# Patient Record
Sex: Male | Born: 1990 | Race: Black or African American | Hispanic: No | Marital: Single | Smoking: Current every day smoker
Health system: Southern US, Community
[De-identification: ages and names within clinical notes are randomized; demographics above are authoritative.]

## PROBLEM LIST (undated history)

## (undated) DIAGNOSIS — Z789 Other specified health status: Secondary | ICD-10-CM

## (undated) DIAGNOSIS — J45909 Unspecified asthma, uncomplicated: Secondary | ICD-10-CM

## (undated) HISTORY — PX: NO PAST SURGERIES: SHX2092

## (undated) HISTORY — PX: FRACTURE SURGERY: SHX138

---
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
## 2012-10-22 ENCOUNTER — Other Ambulatory Visit (HOSPITAL_COMMUNITY)
Admission: RE | Admit: 2012-10-22 | Discharge: 2012-10-22 | Disposition: A | Discharge status: Home / Self Care | Payer: Self-pay | Source: Ambulatory Visit | Attending: Emergency Medicine | Admitting: Emergency Medicine

## 2012-10-22 ENCOUNTER — Emergency Department (INDEPENDENT_AMBULATORY_CARE_PROVIDER_SITE_OTHER)
Admission: EM | Admit: 2012-10-22 | Discharge: 2012-10-22 | Disposition: A | Discharge status: Home / Self Care | Payer: Self-pay | Source: Home / Self Care | Attending: Emergency Medicine | Admitting: Emergency Medicine

## 2012-10-22 ENCOUNTER — Encounter (HOSPITAL_COMMUNITY): Payer: Self-pay

## 2012-10-22 DIAGNOSIS — Z113 Encounter for screening for infections with a predominantly sexual mode of transmission: Secondary | ICD-10-CM | POA: Insufficient documentation

## 2012-10-22 DIAGNOSIS — A64 Unspecified sexually transmitted disease: Secondary | ICD-10-CM

## 2012-10-22 MED ORDER — AZITHROMYCIN 250 MG PO TABS: 1000.0000 mg | ORAL_TABLET | Freq: Every day | ORAL | Status: DC

## 2012-10-22 MED ORDER — AZITHROMYCIN 250 MG PO TABS
1000.0000 mg | ORAL_TABLET | Freq: Once | ORAL | Status: AC
Start: 2012-10-22 — End: 2012-10-22
  Administered 2012-10-22: 1000 mg via ORAL

## 2012-10-22 MED ORDER — CEFTRIAXONE SODIUM 250 MG IJ SOLR
INTRAMUSCULAR | Status: AC
  Filled 2012-10-22: qty 250

## 2012-10-22 MED ORDER — LIDOCAINE HCL (PF) 1 % IJ SOLN
INTRAMUSCULAR | Status: AC
  Filled 2012-10-22: qty 5

## 2012-10-22 MED ORDER — AZITHROMYCIN 250 MG PO TABS
ORAL_TABLET | ORAL | Status: AC
  Filled 2012-10-22: qty 4

## 2012-10-22 MED ORDER — CEFTRIAXONE SODIUM 250 MG IJ SOLR
250.0000 mg | Freq: Once | INTRAMUSCULAR | Status: AC
  Administered 2012-10-22: 250 mg via INTRAMUSCULAR

## 2012-10-22 NOTE — ED Provider Notes (Signed)
History     CSN: [id number]  Arrival date & time 10/22/12  1352   First MD Initiated Contact with Patient 10/22/12 1427      Chief Complaint  Patient presents with  . SEXUALLY TRANSMITTED DISEASE    (Consider location/radiation/quality/duration/timing/severity/associated sxs/prior treatment) HPI Comments: Patient presents urgent care as his sexual partner has been diagnosed with gonorrhea and chlamydia. Patient denies experiencing any symptoms such as increased frequency burning or pressure with urination and no penile discharge. No flank pain fevers pelvic pain or testicular pain. Patient denies having had a previous sexual transmitted illness  The history is provided by the patient.    History reviewed. No pertinent past medical history.  History reviewed. No pertinent past surgical history.  History reviewed. No pertinent family history.  History  Substance Use Topics  . Smoking status: Current Some Day Smoker  . Smokeless tobacco: Not on file  . Alcohol Use: Yes      Review of Systems  Constitutional: Negative for fever, chills and activity change.  Cardiovascular: Negative for chest pain.  Gastrointestinal: Negative for nausea, vomiting, abdominal pain, diarrhea and anal bleeding.  Genitourinary: Negative for dysuria, urgency, decreased urine volume, discharge, penile swelling, scrotal swelling, difficulty urinating, penile pain and testicular pain.  Skin: Negative for rash.    Allergies  Review of patient's allergies indicates no known allergies.  Home Medications  No current outpatient prescriptions on file.  BP 133/73  Pulse 79  Temp(Src) 98.3 F (36.8 C) (Oral)  Resp 16  SpO2 100%  Physical Exam  Nursing note and vitals reviewed. Constitutional: He appears well-developed and well-nourished.  Abdominal: Soft. He exhibits no distension. There is no tenderness.  Neurological: He is alert.  Skin: No rash noted. No erythema.    ED Course  Procedures  (including critical care time)  Labs Reviewed  URINE CYTOLOGY ANCILLARY ONLY   No results found.   1. Sexually transmissible disease       MDM  Partner of a patient diagnosed with both chlamydia and gonorrhea. Patient has been treated today with pulse after salt and azithromycin. Patient is asymptomatic tolerated treatment well he urine sample was collected to be screened for STDs.        Jimmie Molly, MD 10/22/12 [phone]

## 2012-10-22 NOTE — ED Notes (Signed)
Partner here for STD exam ; denies syx

## 2012-10-26 ENCOUNTER — Telehealth (HOSPITAL_COMMUNITY): Payer: Self-pay | Admitting: *Deleted

## 2012-10-26 NOTE — ED Notes (Signed)
GC neg., Chlamydia pos., Trich neg.  Pt. adequately treated with Zithromax and Rocephin.  Unable to contact pt. by phone.  Pt.'s cell number does not accept incoming calls and emergency contact number is not valid. Confidential letter sent with diagnosis and STD instructions.  DHHS form completed and faxed to the [facility]. Vassie Moselle 10/26/2012

## 2013-06-08 ENCOUNTER — Emergency Department (HOSPITAL_COMMUNITY)
Admission: EM | Admit: 2013-06-08 | Discharge: 2013-06-08 | Disposition: A | Discharge status: Home / Self Care | Payer: Self-pay | Attending: Emergency Medicine | Admitting: Emergency Medicine

## 2013-06-08 ENCOUNTER — Encounter (HOSPITAL_COMMUNITY): Payer: Self-pay | Admitting: Emergency Medicine

## 2013-06-08 DIAGNOSIS — Z0000 Encounter for general adult medical examination without abnormal findings: Secondary | ICD-10-CM

## 2013-06-08 DIAGNOSIS — F17200 Nicotine dependence, unspecified, uncomplicated: Secondary | ICD-10-CM | POA: Insufficient documentation

## 2013-06-08 DIAGNOSIS — Z008 Encounter for other general examination: Secondary | ICD-10-CM | POA: Insufficient documentation

## 2013-06-08 NOTE — ED Notes (Signed)
Pt states that he was drinking tonight and took 4 aleve, then his stomach started cramping and he was bending over, his girlfriend started panicking and called ems and [employer], they brought him here to be evaluated. Pt states that he's not trying to harm himself he's just a little drunk.
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
## 2014-02-02 ENCOUNTER — Encounter (HOSPITAL_COMMUNITY): Payer: Self-pay | Admitting: Emergency Medicine

## 2014-02-02 ENCOUNTER — Encounter (HOSPITAL_COMMUNITY)
Admission: EM | Disposition: A | Discharge status: Home / Self Care | Payer: Self-pay | Source: Home / Self Care | Attending: Emergency Medicine

## 2014-02-02 ENCOUNTER — Emergency Department (INDEPENDENT_AMBULATORY_CARE_PROVIDER_SITE_OTHER): Payer: Self-pay

## 2014-02-02 ENCOUNTER — Observation Stay (HOSPITAL_COMMUNITY)
Admission: EM | Admit: 2014-02-02 | Discharge: 2014-02-03 | Disposition: A | Discharge status: Home / Self Care | Payer: Self-pay | Attending: Orthopedic Surgery | Admitting: Orthopedic Surgery

## 2014-02-02 ENCOUNTER — Emergency Department (INDEPENDENT_AMBULATORY_CARE_PROVIDER_SITE_OTHER)
Admission: EM | Admit: 2014-02-02 | Discharge: 2014-02-02 | Disposition: A | Discharge status: Nursing Home (Includes ICF) | Payer: Self-pay | Source: Home / Self Care

## 2014-02-02 ENCOUNTER — Encounter (HOSPITAL_COMMUNITY): Payer: Self-pay | Admitting: Anesthesiology

## 2014-02-02 ENCOUNTER — Emergency Department (HOSPITAL_COMMUNITY): Payer: Self-pay | Admitting: Anesthesiology

## 2014-02-02 ENCOUNTER — Emergency Department (HOSPITAL_COMMUNITY): Payer: Self-pay

## 2014-02-02 DIAGNOSIS — S61409A Unspecified open wound of unspecified hand, initial encounter: Principal | ICD-10-CM | POA: Insufficient documentation

## 2014-02-02 DIAGNOSIS — S61412A Laceration without foreign body of left hand, initial encounter: Secondary | ICD-10-CM

## 2014-02-02 DIAGNOSIS — Y289XXA Contact with unspecified sharp object, undetermined intent, initial encounter: Secondary | ICD-10-CM | POA: Insufficient documentation

## 2014-02-02 DIAGNOSIS — L089 Local infection of the skin and subcutaneous tissue, unspecified: Secondary | ICD-10-CM

## 2014-02-02 DIAGNOSIS — L02519 Cutaneous abscess of unspecified hand: Secondary | ICD-10-CM

## 2014-02-02 DIAGNOSIS — M009 Pyogenic arthritis, unspecified: Secondary | ICD-10-CM | POA: Insufficient documentation

## 2014-02-02 DIAGNOSIS — F17200 Nicotine dependence, unspecified, uncomplicated: Secondary | ICD-10-CM | POA: Insufficient documentation

## 2014-02-02 DIAGNOSIS — S61432A Puncture wound without foreign body of left hand, initial encounter: Secondary | ICD-10-CM

## 2014-02-02 DIAGNOSIS — IMO0002 Reserved for concepts with insufficient information to code with codable children: Secondary | ICD-10-CM

## 2014-02-02 DIAGNOSIS — L03119 Cellulitis of unspecified part of limb: Secondary | ICD-10-CM

## 2014-02-02 DIAGNOSIS — S62339B Displaced fracture of neck of unspecified metacarpal bone, initial encounter for open fracture: Secondary | ICD-10-CM | POA: Insufficient documentation

## 2014-02-02 DIAGNOSIS — L03114 Cellulitis of left upper limb: Secondary | ICD-10-CM

## 2014-02-02 HISTORY — DX: Other specified health status: Z78.9

## 2014-02-02 HISTORY — PX: I&D EXTREMITY: SHX5045

## 2014-02-02 LAB — CBC WITH DIFFERENTIAL/PLATELET
BASOS ABS: 0 10*3/uL (ref 0.0–0.1)
BASOS PCT: 0 % (ref 0–1)
EOS PCT: 2 % (ref 0–5)
Eosinophils Absolute: 0.2 10*3/uL (ref 0.0–0.7)
HEMATOCRIT: 41.2 % (ref 39.0–52.0)
Hemoglobin: 13.5 g/dL (ref 13.0–17.0)
LYMPHS PCT: 24 % (ref 12–46)
Lymphs Abs: 2 10*3/uL (ref 0.7–4.0)
MCH: 30.8 pg (ref 26.0–34.0)
MCHC: 32.8 g/dL (ref 30.0–36.0)
MCV: 93.8 fL (ref 78.0–100.0)
MONO ABS: 0.7 10*3/uL (ref 0.1–1.0)
MONOS PCT: 9 % (ref 3–12)
Neutro Abs: 5.3 10*3/uL (ref 1.7–7.7)
Neutrophils Relative %: 65 % (ref 43–77)
Platelets: 171 10*3/uL (ref 150–400)
RBC: 4.39 MIL/uL (ref 4.22–5.81)
RDW: 13.5 % (ref 11.5–15.5)
WBC: 8.2 10*3/uL (ref 4.0–10.5)

## 2014-02-02 LAB — BASIC METABOLIC PANEL
ANION GAP: 11 (ref 5–15)
BUN: 11 mg/dL (ref 6–23)
CALCIUM: 9.1 mg/dL (ref 8.4–10.5)
CO2: 27 meq/L (ref 19–32)
CREATININE: 1.08 mg/dL (ref 0.50–1.35)
Chloride: 103 mEq/L (ref 96–112)
GFR calc Af Amer: >90 mL/min (ref >90)
GFR calc non Af Amer: >90 mL/min (ref >90)
Glucose, Bld: 76 mg/dL (ref 70–99)
Potassium: 4.2 mEq/L (ref 3.7–5.3)
Sodium: 141 mEq/L (ref 137–147)

## 2014-02-02 SURGERY — IRRIGATION AND DEBRIDEMENT EXTREMITY
Anesthesia: General | Site: Hand | Laterality: Left

## 2014-02-02 MED ORDER — OXYCODONE HCL 5 MG PO TABS
5.0000 mg | ORAL_TABLET | Freq: Once | ORAL | Status: AC | PRN
  Administered 2014-02-02: 5 mg via ORAL

## 2014-02-02 MED ORDER — SODIUM CHLORIDE 0.9 % IR SOLN
Status: DC | PRN
  Administered 2014-02-02: 1000 mL
  Administered 2014-02-02: 3000 mL

## 2014-02-02 MED ORDER — SODIUM CHLORIDE 0.9 % IV SOLN
3.0000 g | Freq: Four times a day (QID) | INTRAVENOUS | Status: DC
  Administered 2014-02-03: 3 g via INTRAVENOUS
  Filled 2014-02-02 (×4): qty 3

## 2014-02-02 MED ORDER — OXYCODONE HCL 5 MG/5ML PO SOLN: 5.0000 mg | Freq: Once | ORAL | Status: AC | PRN

## 2014-02-02 MED ORDER — ADULT MULTIVITAMIN W/MINERALS CH
1.0000 | ORAL_TABLET | Freq: Every day | ORAL | Status: DC
Start: 2014-02-03 — End: 2014-02-03
  Filled 2014-02-02: qty 1

## 2014-02-02 MED ORDER — HYDROMORPHONE HCL PF 1 MG/ML IJ SOLN: 0.5000 mg | INTRAMUSCULAR | Status: DC | PRN

## 2014-02-02 MED ORDER — ONDANSETRON HCL 4 MG PO TABS: 4.0000 mg | ORAL_TABLET | Freq: Four times a day (QID) | ORAL | Status: DC | PRN

## 2014-02-02 MED ORDER — PHENYLEPHRINE 40 MCG/ML (10ML) SYRINGE FOR IV PUSH (FOR BLOOD PRESSURE SUPPORT)
PREFILLED_SYRINGE | INTRAVENOUS | Status: AC
  Filled 2014-02-02: qty 10

## 2014-02-02 MED ORDER — METHOCARBAMOL 1000 MG/10ML IJ SOLN
500.0000 mg | Freq: Four times a day (QID) | INTRAVENOUS | Status: DC | PRN
  Filled 2014-02-02: qty 5

## 2014-02-02 MED ORDER — ONDANSETRON HCL 4 MG/2ML IJ SOLN: 4.0000 mg | Freq: Four times a day (QID) | INTRAMUSCULAR | Status: DC | PRN

## 2014-02-02 MED ORDER — DIPHENHYDRAMINE HCL 25 MG PO CAPS: 25.0000 mg | ORAL_CAPSULE | Freq: Four times a day (QID) | ORAL | Status: DC | PRN

## 2014-02-02 MED ORDER — HYDROMORPHONE HCL PF 1 MG/ML IJ SOLN
1.0000 mg | Freq: Once | INTRAMUSCULAR | Status: AC
  Administered 2014-02-02: 1 mg via INTRAVENOUS
  Filled 2014-02-02: qty 1

## 2014-02-02 MED ORDER — ONDANSETRON HCL 4 MG/2ML IJ SOLN
INTRAMUSCULAR | Status: AC
  Filled 2014-02-02: qty 2

## 2014-02-02 MED ORDER — SUCCINYLCHOLINE CHLORIDE 20 MG/ML IJ SOLN
INTRAMUSCULAR | Status: DC | PRN
  Administered 2014-02-02: 120 mg via INTRAVENOUS

## 2014-02-02 MED ORDER — FENTANYL CITRATE 0.05 MG/ML IJ SOLN
50.0000 ug | INTRAMUSCULAR | Status: DC | PRN
  Administered 2014-02-02 (×2): 50 ug via INTRAVENOUS
  Filled 2014-02-02 (×2): qty 2

## 2014-02-02 MED ORDER — METHOCARBAMOL 500 MG PO TABS
500.0000 mg | ORAL_TABLET | Freq: Four times a day (QID) | ORAL | Status: DC | PRN
  Administered 2014-02-02 – 2014-02-03 (×2): 500 mg via ORAL
  Filled 2014-02-02 (×2): qty 1

## 2014-02-02 MED ORDER — ONDANSETRON HCL 4 MG/2ML IJ SOLN
INTRAMUSCULAR | Status: DC | PRN
  Administered 2014-02-02: 4 mg via INTRAVENOUS

## 2014-02-02 MED ORDER — OXYCODONE-ACETAMINOPHEN 5-325 MG PO TABS
1.0000 | ORAL_TABLET | ORAL | Status: DC | PRN
  Administered 2014-02-03 (×2): 2 via ORAL
  Filled 2014-02-02 (×2): qty 2

## 2014-02-02 MED ORDER — VITAMIN C 500 MG PO TABS
1000.0000 mg | ORAL_TABLET | Freq: Every day | ORAL | Status: DC
  Filled 2014-02-02: qty 2

## 2014-02-02 MED ORDER — MIDAZOLAM HCL 2 MG/2ML IJ SOLN
INTRAMUSCULAR | Status: AC
  Filled 2014-02-02: qty 2

## 2014-02-02 MED ORDER — LACTATED RINGERS IV SOLN
INTRAVENOUS | Status: DC | PRN
  Administered 2014-02-02: 21:00:00 via INTRAVENOUS

## 2014-02-02 MED ORDER — AMOXICILLIN-POT CLAVULANATE 875-125 MG PO TABS: 1.0000 | ORAL_TABLET | Freq: Two times a day (BID) | ORAL | Status: DC

## 2014-02-02 MED ORDER — SODIUM CHLORIDE 0.9 % IV SOLN
3.0000 g | INTRAVENOUS | Status: AC
  Administered 2014-02-02: 3 g via INTRAVENOUS
  Filled 2014-02-02: qty 3

## 2014-02-02 MED ORDER — HYDROMORPHONE HCL PF 1 MG/ML IJ SOLN
INTRAMUSCULAR | Status: AC
  Filled 2014-02-02: qty 1

## 2014-02-02 MED ORDER — SUCCINYLCHOLINE CHLORIDE 20 MG/ML IJ SOLN
INTRAMUSCULAR | Status: AC
  Filled 2014-02-02: qty 1

## 2014-02-02 MED ORDER — ARTIFICIAL TEARS OP OINT
TOPICAL_OINTMENT | OPHTHALMIC | Status: AC
  Filled 2014-02-02: qty 3.5

## 2014-02-02 MED ORDER — ARTIFICIAL TEARS OP OINT
TOPICAL_OINTMENT | OPHTHALMIC | Status: DC | PRN
  Administered 2014-02-02: 1 via OPHTHALMIC

## 2014-02-02 MED ORDER — HYDROCODONE-ACETAMINOPHEN 5-325 MG PO TABS
1.0000 | ORAL_TABLET | ORAL | Status: DC | PRN
  Administered 2014-02-03 (×2): 2 via ORAL
  Filled 2014-02-02 (×2): qty 2

## 2014-02-02 MED ORDER — LIDOCAINE HCL (CARDIAC) 20 MG/ML IV SOLN
INTRAVENOUS | Status: DC | PRN
  Administered 2014-02-02: 100 mg via INTRAVENOUS

## 2014-02-02 MED ORDER — FENTANYL CITRATE 0.05 MG/ML IJ SOLN
INTRAMUSCULAR | Status: DC | PRN
  Administered 2014-02-02 (×2): 100 ug via INTRAVENOUS
  Administered 2014-02-02: 50 ug via INTRAVENOUS

## 2014-02-02 MED ORDER — PROPOFOL 10 MG/ML IV BOLUS
INTRAVENOUS | Status: DC | PRN
  Administered 2014-02-02: 200 mg via INTRAVENOUS

## 2014-02-02 MED ORDER — ONDANSETRON HCL 4 MG/2ML IJ SOLN
4.0000 mg | Freq: Once | INTRAMUSCULAR | Status: AC
Start: 2014-02-02 — End: 2014-02-02
  Administered 2014-02-02: 4 mg via INTRAVENOUS
  Filled 2014-02-02: qty 2

## 2014-02-02 MED ORDER — SODIUM CHLORIDE 0.9 % IV BOLUS (SEPSIS)
1000.0000 mL | Freq: Once | INTRAVENOUS | Status: AC
  Administered 2014-02-02: 1000 mL via INTRAVENOUS

## 2014-02-02 MED ORDER — KCL IN DEXTROSE-NACL 20-5-0.45 MEQ/L-%-% IV SOLN
INTRAVENOUS | Status: DC
  Administered 2014-02-03: 01:00:00 via INTRAVENOUS
  Filled 2014-02-02 (×3): qty 1000

## 2014-02-02 MED ORDER — FENTANYL CITRATE 0.05 MG/ML IJ SOLN
INTRAMUSCULAR | Status: AC
  Administered 2014-02-02: 50 ug
  Filled 2014-02-02: qty 2

## 2014-02-02 MED ORDER — DOCUSATE SODIUM 100 MG PO CAPS: 100.0000 mg | ORAL_CAPSULE | Freq: Two times a day (BID) | ORAL | Status: DC

## 2014-02-02 MED ORDER — OXYCODONE-ACETAMINOPHEN 10-325 MG PO TABS: 1.0000 | ORAL_TABLET | ORAL | Status: DC | PRN

## 2014-02-02 MED ORDER — ZOLPIDEM TARTRATE 5 MG PO TABS
5.0000 mg | ORAL_TABLET | Freq: Every evening | ORAL | Status: DC | PRN
  Administered 2014-02-02: 5 mg via ORAL
  Filled 2014-02-02: qty 1

## 2014-02-02 MED ORDER — FENTANYL CITRATE 0.05 MG/ML IJ SOLN
INTRAMUSCULAR | Status: AC
  Filled 2014-02-02: qty 5

## 2014-02-02 MED ORDER — HYDROMORPHONE HCL PF 1 MG/ML IJ SOLN
0.2500 mg | INTRAMUSCULAR | Status: DC | PRN
  Administered 2014-02-02 (×2): 0.5 mg via INTRAVENOUS

## 2014-02-02 MED ORDER — OXYCODONE HCL 5 MG PO TABS
ORAL_TABLET | ORAL | Status: AC
  Filled 2014-02-02: qty 1

## 2014-02-02 MED ORDER — PROPOFOL 10 MG/ML IV BOLUS
INTRAVENOUS | Status: AC
  Filled 2014-02-02: qty 20

## 2014-02-02 SURGICAL SUPPLY — 55 items
BANDAGE ELASTIC 3 VELCRO ST LF (GAUZE/BANDAGES/DRESSINGS) ×3 IMPLANT
BANDAGE ELASTIC 4 VELCRO ST LF (GAUZE/BANDAGES/DRESSINGS) ×3 IMPLANT
BANDAGE GAUZE ELAST BULKY 4 IN (GAUZE/BANDAGES/DRESSINGS) ×3 IMPLANT
BNDG COHESIVE 1X5 TAN STRL LF (GAUZE/BANDAGES/DRESSINGS) IMPLANT
BNDG CONFORM 2 STRL LF (GAUZE/BANDAGES/DRESSINGS) ×3 IMPLANT
BNDG ESMARK 4X9 LF (GAUZE/BANDAGES/DRESSINGS) ×3 IMPLANT
BNDG GAUZE ELAST 4 BULKY (GAUZE/BANDAGES/DRESSINGS) ×3 IMPLANT
CORDS BIPOLAR (ELECTRODE) ×3 IMPLANT
COVER SURGICAL LIGHT HANDLE (MISCELLANEOUS) ×3 IMPLANT
CUFF TOURNIQUET SINGLE 18IN (TOURNIQUET CUFF) ×3 IMPLANT
CUFF TOURNIQUET SINGLE 24IN (TOURNIQUET CUFF) IMPLANT
DRAIN PENROSE 1/4X12 LTX STRL (WOUND CARE) IMPLANT
DRAPE SURG 17X23 STRL (DRAPES) ×3 IMPLANT
DRSG ADAPTIC 3X8 NADH LF (GAUZE/BANDAGES/DRESSINGS) ×3 IMPLANT
ELECT REM PT RETURN 9FT ADLT (ELECTROSURGICAL) ×3
ELECTRODE REM PT RTRN 9FT ADLT (ELECTROSURGICAL) ×1 IMPLANT
GAUZE XEROFORM 1X8 LF (GAUZE/BANDAGES/DRESSINGS) ×3 IMPLANT
GAUZE XEROFORM 5X9 LF (GAUZE/BANDAGES/DRESSINGS) IMPLANT
GLOVE BIOGEL PI IND STRL 8.5 (GLOVE) ×1 IMPLANT
GLOVE BIOGEL PI INDICATOR 8.5 (GLOVE) ×2
GLOVE SURG ORTHO 8.0 STRL STRW (GLOVE) ×3 IMPLANT
GOWN STRL REUS W/ TWL LRG LVL3 (GOWN DISPOSABLE) ×3 IMPLANT
GOWN STRL REUS W/ TWL XL LVL3 (GOWN DISPOSABLE) ×1 IMPLANT
GOWN STRL REUS W/TWL LRG LVL3 (GOWN DISPOSABLE) ×6
GOWN STRL REUS W/TWL XL LVL3 (GOWN DISPOSABLE) ×2
HANDPIECE INTERPULSE COAX TIP (DISPOSABLE)
KIT BASIN OR (CUSTOM PROCEDURE TRAY) ×3 IMPLANT
KIT ROOM TURNOVER OR (KITS) ×3 IMPLANT
MANIFOLD NEPTUNE II (INSTRUMENTS) ×3 IMPLANT
NEEDLE HYPO 25GX1X1/2 BEV (NEEDLE) IMPLANT
NS IRRIG 1000ML POUR BTL (IV SOLUTION) ×3 IMPLANT
PACK ORTHO EXTREMITY (CUSTOM PROCEDURE TRAY) ×3 IMPLANT
PAD ARMBOARD 7.5X6 YLW CONV (MISCELLANEOUS) ×6 IMPLANT
PAD CAST 4YDX4 CTTN HI CHSV (CAST SUPPLIES) ×1 IMPLANT
PADDING CAST COTTON 4X4 STRL (CAST SUPPLIES) ×2
SET HNDPC FAN SPRY TIP SCT (DISPOSABLE) IMPLANT
SOAP 2 % CHG 4 OZ (WOUND CARE) ×3 IMPLANT
SPLINT FIBERGLASS 3X35 (CAST SUPPLIES) ×3 IMPLANT
SPONGE GAUZE 4X4 12PLY (GAUZE/BANDAGES/DRESSINGS) ×3 IMPLANT
SPONGE LAP 18X18 X RAY DECT (DISPOSABLE) ×3 IMPLANT
SPONGE LAP 4X18 X RAY DECT (DISPOSABLE) ×3 IMPLANT
SUCTION FRAZIER TIP 10 FR DISP (SUCTIONS) ×3 IMPLANT
SUT ETHILON 4 0 PS 2 18 (SUTURE) IMPLANT
SUT ETHILON 5 0 P 3 18 (SUTURE) ×2
SUT NYLON ETHILON 5-0 P-3 1X18 (SUTURE) ×1 IMPLANT
SUT PROLENE 3 0 PS 2 (SUTURE) ×3 IMPLANT
SYR CONTROL 10ML LL (SYRINGE) IMPLANT
TOWEL OR 17X24 6PK STRL BLUE (TOWEL DISPOSABLE) ×3 IMPLANT
TOWEL OR 17X26 10 PK STRL BLUE (TOWEL DISPOSABLE) ×3 IMPLANT
TUBE ANAEROBIC SPECIMEN COL (MISCELLANEOUS) ×3 IMPLANT
TUBE CONNECTING 12'X1/4 (SUCTIONS) ×1
TUBE CONNECTING 12X1/4 (SUCTIONS) ×2 IMPLANT
UNDERPAD 30X30 INCONTINENT (UNDERPADS AND DIAPERS) ×3 IMPLANT
WATER STERILE IRR 1000ML POUR (IV SOLUTION) ×3 IMPLANT
YANKAUER SUCT BULB TIP NO VENT (SUCTIONS) ×3 IMPLANT

## 2014-02-02 NOTE — H&P (Signed)
Luis Huynh is an 23 y.o. male.   Chief Complaint: left hand pain HPI:  Patient is a 23 y.o. male presenting with hand injury. The history is provided by the patient.  Hand Injury Associated symptoms: no back pain, no fever and no neck pain  pt states 2 days ago hit glass window w left hand. Laceration middle finger and left MCP region at time of injury. Since then, persistent/worsening pain, swelling, and redness to left hand. Purulent drainage from wound. Pt denies hitting a mouth or tooth. Tetanus unknown. Right hand dominant. No fever or chills. Denies associated numbness/weakness. Denies any other pain or injury.   History reviewed. No pertinent past medical history.  History reviewed. No pertinent past surgical history.  History reviewed. No pertinent family history. Social History:  reports that he has been smoking.  He does not have any smokeless tobacco history on file. He reports that he drinks alcohol. His drug history is not on file.  Allergies:  Allergies  Allergen Reactions  . Xanax [Alprazolam] Itching    Possible allergy.Evelina Bucy onetime and made him itch    No prescriptions prior to admission    Results for orders placed during the hospital encounter of 02/02/14 (from the past 48 hour(s))  CBC WITH DIFFERENTIAL     Status: None   Collection Time    02/02/14  1:32 PM      Result Value Ref Range   WBC 8.2  4.0 - 10.5 K/uL   RBC 4.39  4.22 - 5.81 MIL/uL   Hemoglobin 13.5  13.0 - 17.0 g/dL   HCT 41.2  39.0 - 52.0 %   MCV 93.8  78.0 - 100.0 fL   MCH 30.8  26.0 - 34.0 pg   MCHC 32.8  30.0 - 36.0 g/dL   RDW 13.5  11.5 - 15.5 %   Platelets 171  150 - 400 K/uL   Neutrophils Relative % 65  43 - 77 %   Neutro Abs 5.3  1.7 - 7.7 K/uL   Lymphocytes Relative 24  12 - 46 %   Lymphs Abs 2.0  0.7 - 4.0 K/uL   Monocytes Relative 9  3 - 12 %   Monocytes Absolute 0.7  0.1 - 1.0 K/uL   Eosinophils Relative 2  0 - 5 %   Eosinophils Absolute 0.2  0.0 - 0.7 K/uL   Basophils  Relative 0  0 - 1 %   Basophils Absolute 0.0  0.0 - 0.1 K/uL  BASIC METABOLIC PANEL     Status: None   Collection Time    02/02/14  1:32 PM      Result Value Ref Range   Sodium 141  137 - 147 mEq/L   Potassium 4.2  3.7 - 5.3 mEq/L   Chloride 103  96 - 112 mEq/L   CO2 27  19 - 32 mEq/L   Glucose, Bld 76  70 - 99 mg/dL   BUN 11  6 - 23 mg/dL   Creatinine, Ser 1.08  0.50 - 1.35 mg/dL   Calcium 9.1  8.4 - 10.5 mg/dL   GFR calc non Af Amer >90  >90 mL/min   GFR calc Af Amer >90  >90 mL/min   Comment: (NOTE)     The eGFR has been calculated using the CKD EPI equation.     This calculation has not been validated in all clinical situations.     eGFR's persistently <90 mL/min signify possible Chronic Kidney  Disease.   Anion gap 11  5 - 15   Dg Hand Complete Left  02/02/2014   CLINICAL DATA:  Punched glass 2 days ago.  Left hand pain  EXAM: LEFT HAND - COMPLETE 3+ VIEW  COMPARISON:  None.  FINDINGS: There is no evidence of fracture or dislocation. There is no evidence of arthropathy or other focal bone abnormality. Soft tissues are unremarkable.  IMPRESSION: Negative.   Electronically Signed   By: Kathreen Devoid   On: 02/02/2014 11:51    ROS  NO RECENT ILLNESSES OR HOSPITALIZATIONS   Blood pressure 161/82, pulse 82, temperature 97.8 F (36.6 C), temperature source Oral, resp. rate 20, weight 68.04 kg (150 lb), SpO2 100.00%. Physical Exam  General Appearance:  Alert, cooperative, no distress, appears stated age  Head:  Normocephalic, without obvious abnormality, atraumatic  Eyes:  Pupils equal, conjunctiva/corneas clear,         Throat: Lips, mucosa, and tongue normal; teeth and gums normal  Neck: No visible masses     Lungs:   respirations unlabored  Chest Wall:  No tenderness or deformity  Heart:  Regular rate and rhythm,  Abdomen:   Soft, non-tender,         Extremities: LEFT HAND: DORSAL WOUND OVER LONG FINGER MCP REGION, PICTURES IN CHART FINGERS WARM WELL PERFUSED LIMITED  DIGITAL MOTION  Pulses: 2+ and symmetric  Skin: Skin color, texture, turgor normal, no rashes or lesions     Neurologic: Normal    Assessment/Plan LEFT HAND MCP JOINT INFECTION  LEFT HAND MCP JOINT IRRIGATION AND DEBRIDEMENT AND REPAIR AS INDICATED  R/B/A DISCUSSED WITH PT IN HOSPITAL.  PT VOICED UNDERSTANDING OF PLAN CONSENT SIGNED DAY OF SURGERY PT SEEN AND EXAMINED PRIOR TO OPERATIVE PROCEDURE/DAY OF SURGERY SITE MARKED. QUESTIONS ANSWERED WILL GO HOME FOLLOWING SURGERY  WE ARE PLANNING SURGERY FOR YOUR UPPER EXTREMITY. THE RISKS AND BENEFITS OF SURGERY INCLUDE BUT NOT LIMITED TO BLEEDING INFECTION, DAMAGE TO NEARBY NERVES ARTERIES TENDONS, FAILURE OF SURGERY TO ACCOMPLISH ITS INTENDED GOALS, PERSISTENT SYMPTOMS AND NEED FOR FURTHER SURGICAL INTERVENTION. WITH THIS IN MIND WE WILL PROCEED. I HAVE DISCUSSED WITH THE PATIENT THE PRE AND POSTOPERATIVE REGIMEN AND THE DOS AND DON'TS. PT VOICED UNDERSTANDING AND INFORMED CONSENT SIGNED.  Linna Hoff 02/02/2014, 8:55 PM

## 2014-02-02 NOTE — Transfer of Care (Signed)
Immediate Anesthesia Transfer of Care Note  Patient: Luis Huynh  Procedure(s) Performed: Procedure(s): Incision and Drainage of left hand Infection (Left)  Patient Location: PACU  Anesthesia Type:General  Level of Consciousness: awake  Airway & Oxygen Therapy: Patient Spontanous Breathing and Patient connected to nasal cannula oxygen  Post-op Assessment: Report given to PACU RN, Post -op Vital signs reviewed and stable and Patient moving all extremities  Post vital signs: Reviewed and stable  Complications: No apparent anesthesia complications

## 2014-02-02 NOTE — Anesthesia Procedure Notes (Signed)
Procedure Name: Intubation Date/Time: 02/02/2014 8:55 PM Performed by: Luster LandsbergHASE, Megan Presti R Pre-anesthesia Checklist: Patient identified, Emergency Drugs available, Suction available and Patient being monitored Patient Re-evaluated:Patient Re-evaluated prior to inductionOxygen Delivery Method: Circle system utilized Preoxygenation: Pre-oxygenation with 100% oxygen Intubation Type: IV induction, Rapid sequence and Cricoid Pressure applied Laryngoscope Size: Mac and 3 Grade View: Grade I Tube type: Oral Tube size: 7.5 mm Number of attempts: 1 Airway Equipment and Method: Stylet Placement Confirmation: ETT inserted through vocal cords under direct vision and breath sounds checked- equal and bilateral Secured at: 22 cm Tube secured with: Tape Dental Injury: Teeth and Oropharynx as per pre-operative assessment

## 2014-02-02 NOTE — ED Notes (Signed)
Pt. Very upset. Put all of his clothes on and took off monitor cords. States "I want to go home". Explained to patient risks of leaving. Pt. Agreed to stay. Updated patient on plan of care. Delayed explained. Pt. Angry but cooperating at this time.

## 2014-02-02 NOTE — ED Provider Notes (Signed)
CSN: [id number]     Arrival date & time 02/02/14  1053 History   First MD Initiated Contact with Patient 02/02/14 1117     Chief Complaint  Patient presents with  . Laceration   (Consider location/radiation/quality/duration/timing/severity/associated sxs/prior Treatment) HPI Comments: 23 y o m punched glass window 2 d ago producing a wound over the Left 3rd MCP. Now with marked swelling over the wound and hand. Wound is purulent.    History reviewed. No pertinent past medical history. History reviewed. No pertinent past surgical history. History reviewed. No pertinent family history. History  Substance Use Topics  . Smoking status: Current Some Day Smoker  . Smokeless tobacco: Not on file  . Alcohol Use: Yes    Review of Systems  Constitutional: Negative.   Respiratory: Negative.   Cardiovascular: Negative for chest pain.  Genitourinary: Negative.   Musculoskeletal: Positive for joint swelling.       As per HPI  Skin: Positive for wound.  Neurological: Negative for dizziness, weakness, numbness and headaches.    Allergies  Review of patient's allergies indicates no known allergies.  Home Medications   Prior to Admission medications   Not on File   BP 133/97  Pulse 70  Temp(Src) 99.3 F (37.4 C) (Oral)  Resp 14  SpO2 100% Physical Exam  Nursing note and vitals reviewed. Constitutional: He is oriented to person, place, and time. He appears well-developed and well-nourished. No distress.  HENT:  Head: Normocephalic and atraumatic.  Neck: Normal range of motion. Neck supple.  Cardiovascular: Normal rate.   Pulmonary/Chest: Effort normal. No respiratory distress.  Musculoskeletal:  Edema to hand, lesser to 3rd finger. No finger tenderness. Much tenderness to the hand, greatest over the MCP at the site of the wound. Limited ROM of 3rd digit re flex and ext off the MCP.   Neurological: He is alert and oriented to person, place, and time. No cranial nerve deficit.   Skin: Skin is warm and dry.  Stellate type laceration over 3rd MCP with crusting and purulent drainage. Underlying swelling. Faint lymphangitis into the forearm.  Psychiatric: He has a normal mood and affect.    ED Course  Procedures (including critical care time) Labs Review Labs Reviewed  WOUND CULTURE    Imaging Review Dg Hand Complete Left  02/02/2014   CLINICAL DATA:  Punched glass 2 days ago.  Left hand pain  EXAM: LEFT HAND - COMPLETE 3+ VIEW  COMPARISON:  None.  FINDINGS: There is no evidence of fracture or dislocation. There is no evidence of arthropathy or other focal bone abnormality. Soft tissues are unremarkable.  IMPRESSION: Negative.   Electronically Signed   By: Elige KoHetal  Patel   On: 02/02/2014 11:51     MDM   1. Laceration of hand with infection, left, initial encounter   2. Cellulitis of hand, left    Evaluation of left hand infection/cellulits with limited function of 3rd MCP joint.  Spoke with Angie at [facility]. Dr. Melvyn Novasrtmann on call and currently in surgery. Advised to send to the ED as they will consult from there.      Hayden Rasmussenavid Zaniah [place], NP 02/02/14 1215

## 2014-02-02 NOTE — ED Notes (Signed)
MD asked about pain medicine for patient

## 2014-02-02 NOTE — Discharge Instructions (Signed)
KEEP [facility] CALL OFFICE FOR F/U APPT [phone] on monday 02/07/2014 KEEP HAND ELEVATED ABOVE HEART OK TO APPLY ICE TO OPERATIVE AREA CONTACT OFFICE IF ANY WORSENING PAIN OR CONCERNS.

## 2014-02-02 NOTE — ED Notes (Signed)
Admitting MD paged about pain medication again

## 2014-02-02 NOTE — ED Notes (Signed)
Pt in c/o possible infection to wound on left hand, pt punched something with glass two days ago and recently area has been swelling, redness noted with open wound, unsure if any glass is in wound, no medications PTA, sent from urgent care for further evaluation

## 2014-02-02 NOTE — ED Notes (Signed)
Reports cutting left middle knuckle on glass two days ago.  Area is swollen, red, hot to touch, and draining pus.   States "I am up to date on tetanus".   No otc meds taken.

## 2014-02-02 NOTE — ED Provider Notes (Addendum)
CSN: [id number]     Arrival date & time 02/02/14  1227 History   First MD Initiated Contact with Patient 02/02/14 1235     Chief Complaint  Patient presents with  . Hand Injury     (Consider location/radiation/quality/duration/timing/severity/associated sxs/prior Treatment) Patient is a 23 y.o. male presenting with hand injury. The history is provided by the patient.  Hand Injury Associated symptoms: no back pain, no fever and no neck pain   pt states 2 days ago hit glass window w left hand. Laceration middle finger and left MCP region at time of injury. Since then, persistent/worsening pain, swelling, and redness to left hand. Purulent drainage from wound. Pt denies hitting a mouth or tooth.  Tetanus unknown. Right hand dominant. No fever or chills. Denies associated numbness/weakness. Denies any other pain or injury.     History reviewed. No pertinent past medical history. History reviewed. No pertinent past surgical history. History reviewed. No pertinent family history. History  Substance Use Topics  . Smoking status: Current Some Day Smoker  . Smokeless tobacco: Not on file  . Alcohol Use: Yes    Review of Systems  Constitutional: Negative for fever and chills.  HENT: Negative for sore throat.   Eyes: Negative for redness.  Respiratory: Negative for shortness of breath.   Cardiovascular: Negative for chest pain.  Gastrointestinal: Negative for vomiting and abdominal pain.  Genitourinary: Negative for flank pain.  Musculoskeletal: Negative for back pain and neck pain.  Skin: Positive for wound.  Neurological: Negative for weakness, numbness and headaches.  Hematological: Does not bruise/bleed easily.  Psychiatric/Behavioral: Negative for confusion.      Allergies  Review of patient's allergies indicates no known allergies.  Home Medications   Prior to Admission medications   Not on File   BP 126/81  Pulse 91  Temp(Src) 97.8 F (36.6 C) (Oral)  Resp 20  Wt  150 lb (68.04 kg)  SpO2 100% Physical Exam  Nursing note and vitals reviewed. Constitutional: He is oriented to person, place, and time. He appears well-developed and well-nourished. No distress.  HENT:  Head: Atraumatic.  Mouth/Throat: Oropharynx is clear and moist.  Eyes: Conjunctivae are normal.  Neck: Neck supple. No tracheal deviation present.  Cardiovascular: Normal rate.   Pulmonary/Chest: Effort normal. No accessory muscle usage. No respiratory distress.  Musculoskeletal: Normal range of motion.  Open wound over left MCP with associated purulent drainage. Dorsum left hand diffusely erythematous, swollen, and tender. Marked pain w movement left middle finger.  Healing 1 cm lac left middle finger. Normal cap refill distally.   Neurological: He is alert and oriented to person, place, and time.  Skin: Skin is warm and dry. He is not diaphoretic.  Psychiatric: He has a normal mood and affect.    ED Course  Procedures (including critical care time)  Dg Hand Complete Left  02/02/2014   CLINICAL DATA:  Punched glass 2 days ago.  Left hand pain  EXAM: LEFT HAND - COMPLETE 3+ VIEW  COMPARISON:  None.  FINDINGS: There is no evidence of fracture or dislocation. There is no evidence of arthropathy or other focal bone abnormality. Soft tissues are unremarkable.  IMPRESSION: Negative.   Electronically Signed   By: Elige KoHetal  Patel   On: 02/02/2014 11:51     EKG Interpretation None      MDM   Iv ns.  xrays reviewed.  requestioned patient - pt states from hitting glass, denies hitting a mouth or tooth.  Ortho hand paged.  Reviewed nursing notes and prior charts for additional history.       Hand surgery to see.  Patient will need iv abx, and OR/washout.   Discussed w Dr Melvyn Novas, he will take to OR, requests we hold abx until in OR and cultures done.  Will keep npo.     Suzi Roots, MD 02/02/14 1341

## 2014-02-02 NOTE — ED Notes (Signed)
Admitting MD paged for admission order/pain medicine.

## 2014-02-02 NOTE — Anesthesia Preprocedure Evaluation (Signed)
Anesthesia Evaluation  Patient identified by MRN, date of birth, ID band Patient awake    Reviewed: Allergy & Precautions, H&P , NPO status , Patient's Chart, lab work & pertinent test results  Airway       Dental no notable dental hx.    Pulmonary Current Smoker,    Pulmonary exam normal       Cardiovascular negative cardio ROS      Neuro/Psych negative neurological ROS  negative psych ROS   GI/Hepatic negative GI ROS, Neg liver ROS,   Endo/Other  negative endocrine ROS  Renal/GU negative Renal ROS  negative genitourinary   Musculoskeletal   Abdominal   Peds  Hematology negative hematology ROS (+)   Anesthesia Other Findings   Reproductive/Obstetrics negative OB ROS                           Anesthesia Physical Anesthesia Plan  ASA: II  Anesthesia Plan: General   Post-op Pain Management:    Induction: Intravenous  Airway Management Planned: LMA and Oral ETT  Additional Equipment:   Intra-op Plan:   Post-operative Plan: Extubation in OR  Informed Consent: I have reviewed the patients History and Physical, chart, labs and discussed the procedure including the risks, benefits and alternatives for the proposed anesthesia with the patient or authorized representative who has indicated his/her understanding and acceptance.   Dental advisory given  Plan Discussed with: CRNA  Anesthesia Plan Comments:         Anesthesia Quick Evaluation

## 2014-02-02 NOTE — ED Notes (Signed)
Dr. Steinl at bedside 

## 2014-02-02 NOTE — Brief Op Note (Signed)
02/02/2014  8:58 PM  PATIENT:  Blythe StanfordShaka B Carapia  23 y.o. male  PRE-OPERATIVE DIAGNOSIS:  Infected Left Hand  POST-OPERATIVE DIAGNOSIS:  * No post-op diagnosis entered *  PROCEDURE:  Procedure(s): IRRIGATION AND DEBRIDEMENT EXTREMITY (Left)  SURGEON:  Surgeon(s) and Role:    * Sharma CovertFred W Beckem Tomberlin, MD - Primary  PHYSICIAN ASSISTANT:   ASSISTANTS: none   ANESTHESIA:   general  EBL:     BLOOD ADMINISTERED:none  DRAINS: none   LOCAL MEDICATIONS USED:  MARCAINE     SPECIMEN:  No Specimen  DISPOSITION OF SPECIMEN:  N/A  COUNTS:  YES  TOURNIQUET:    DICTATION: .[id number]  PLAN OF CARE: Admit for overnight observation  PATIENT DISPOSITION:  PACU - hemodynamically stable.   Delay start of Pharmacological VTE agent (>24hrs) due to surgical blood loss or risk of bleeding: not applicable

## 2014-02-02 NOTE — Discharge Planning (Signed)
[facility] Community Liaison  Spoke to patient about primary care resources and establishing care with a provider. Resource guide and my contact information was provided for any future questions or concerns. No other Community Liaison needs identified at this time.

## 2014-02-03 ENCOUNTER — Encounter (HOSPITAL_COMMUNITY): Payer: Self-pay | Admitting: *Deleted

## 2014-02-03 NOTE — Anesthesia Postprocedure Evaluation (Signed)
  Anesthesia Post-op Note  Patient: Luis Huynh  Procedure(s) Performed: Procedure(s): Incision and Drainage of left hand Infection (Left)  Patient Location: PACU  Anesthesia Type:General  Level of Consciousness: awake and alert   Airway and Oxygen Therapy: Patient Spontanous Breathing  Post-op Pain: moderate  Post-op Assessment: Post-op Vital signs reviewed, Patient's Cardiovascular Status Stable and Respiratory Function Stable  Post-op Vital Signs: Reviewed  Filed Vitals:   02/03/14 0535  BP: 126/68  Pulse: 75  Temp: 36.8 C  Resp: 16    Complications: No apparent anesthesia complications
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
## 2014-02-03 NOTE — ED Provider Notes (Signed)
Medical screening examination/treatment/procedure(s) were performed by a resident physician or non-physician practitioner and as the supervising physician I was immediately available for consultation/collaboration.  Berenice Oehlert, MD    Sharian Delia S Francheska Villeda, MD 02/03/14 0801 

## 2014-02-03 NOTE — Care Management Note (Signed)
  Page 1 of 1   02/03/2014     1:33:44 PM CARE MANAGEMENT NOTE 02/03/2014  Patient:  Luis Huynh,Arsenio B   Account Number:  [id number]  Date Initiated:  02/03/2014  Documentation initiated by:  Ronny FlurryWILE,Brian Zeitlin  Subjective/Objective Assessment:     Action/Plan:   Anticipated DC Date:  02/03/2014   Anticipated DC Plan:  HOME/SELF CARE      DC Planning Services  MATCH Program  [facility]      Choice offered to / List presented to:             Status of service:   Medicare Important Message given?   (If response is "NO", the following Medicare IM given date fields will be blank) Date Medicare IM given:   Medicare IM given by:   Date Additional Medicare IM given:   Additional Medicare IM given by:    Discharge Disposition:    Per UR Regulation:    If discussed at Long Length of Stay Meetings, dates discussed:    Comments:
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
## 2014-02-03 NOTE — Progress Notes (Signed)
Physical Therapy Discharge Patient Details Name: Luis Huynh MRN: [id number] DOB: 12-28-1990 Today's Date: 02/03/2014 Time:  -     Patient discharged from PT services secondary to OT screened PT needs out.  Will sign off at this time. 02/03/2014  Luis Huynh, PT [phone]  (pager)           Luis Huynh, Luis Huynh 02/03/2014, 9:49 AM

## 2014-02-03 NOTE — Progress Notes (Signed)
ED CM received call from patient regarding MATCH letter not able to be processed at the Pharmacy. Reveiwed MATCH enrollment info, Contact[facility] the [facility].  Pharmacist stated patient was trying to fill narcotic prescription with MATCH which is not covered under MATCH. Contacted patient and informed him that MATCH only covers non-narcotic medication, and we would not be able help him with it.   He verbalized understanding, he also mention that [facility]. I informed him that [facility] [address] Pt verbalized understanding teach back done. No further ED CM needs identified.

## 2014-02-04 ENCOUNTER — Encounter (HOSPITAL_COMMUNITY): Payer: Self-pay | Admitting: Orthopedic Surgery

## 2014-02-04 LAB — WOUND CULTURE

## 2014-02-05 LAB — WOUND CULTURE

## 2014-02-07 LAB — ANAEROBIC CULTURE

## 2014-03-02 LAB — FUNGUS CULTURE W SMEAR: Fungal Smear: NONE SEEN

## 2014-03-17 LAB — AFB CULTURE WITH SMEAR (NOT AT ARMC): ACID FAST SMEAR: NONE SEEN

## 2014-11-04 ENCOUNTER — Emergency Department (HOSPITAL_COMMUNITY): Payer: Self-pay

## 2014-11-04 ENCOUNTER — Encounter (HOSPITAL_COMMUNITY): Payer: Self-pay | Admitting: General Practice

## 2014-11-04 ENCOUNTER — Emergency Department (HOSPITAL_COMMUNITY)
Admission: EM | Admit: 2014-11-04 | Discharge: 2014-11-04 | Disposition: A | Discharge status: Home / Self Care | Payer: Self-pay | Attending: Emergency Medicine | Admitting: Emergency Medicine

## 2014-11-04 DIAGNOSIS — Z043 Encounter for examination and observation following other accident: Secondary | ICD-10-CM | POA: Insufficient documentation

## 2014-11-04 DIAGNOSIS — J45909 Unspecified asthma, uncomplicated: Secondary | ICD-10-CM | POA: Insufficient documentation

## 2014-11-04 DIAGNOSIS — R05 Cough: Secondary | ICD-10-CM

## 2014-11-04 DIAGNOSIS — Z720 Tobacco use: Secondary | ICD-10-CM | POA: Insufficient documentation

## 2014-11-04 DIAGNOSIS — Z792 Long term (current) use of antibiotics: Secondary | ICD-10-CM | POA: Insufficient documentation

## 2014-11-04 DIAGNOSIS — B349 Viral infection, unspecified: Secondary | ICD-10-CM | POA: Insufficient documentation

## 2014-11-04 DIAGNOSIS — R059 Cough, unspecified: Secondary | ICD-10-CM

## 2014-11-04 HISTORY — DX: Unspecified asthma, uncomplicated: J45.909

## 2014-11-04 LAB — CBC WITH DIFFERENTIAL/PLATELET
BASOS ABS: 0 10*3/uL (ref 0.0–0.1)
Basophils Relative: 0 % (ref 0–1)
EOS ABS: 0 10*3/uL (ref 0.0–0.7)
EOS PCT: 0 % (ref 0–5)
HCT: 40.4 % (ref 39.0–52.0)
Hemoglobin: 13.8 g/dL (ref 13.0–17.0)
LYMPHS ABS: 1.1 10*3/uL (ref 0.7–4.0)
Lymphocytes Relative: 31 % (ref 12–46)
MCH: 30.1 pg (ref 26.0–34.0)
MCHC: 34.2 g/dL (ref 30.0–36.0)
MCV: 88 fL (ref 78.0–100.0)
Monocytes Absolute: 0.3 10*3/uL (ref 0.1–1.0)
Monocytes Relative: 10 % (ref 3–12)
NEUTROS PCT: 59 % (ref 43–77)
Neutro Abs: 2 10*3/uL (ref 1.7–7.7)
PLATELETS: 123 10*3/uL — AB (ref 150–400)
RBC: 4.59 MIL/uL (ref 4.22–5.81)
RDW: 13.5 % (ref 11.5–15.5)
WBC: 3.4 10*3/uL — ABNORMAL LOW (ref 4.0–10.5)

## 2014-11-04 LAB — HEPATIC FUNCTION PANEL
ALBUMIN: 3.8 g/dL (ref 3.5–5.2)
ALK PHOS: 40 U/L (ref 39–117)
ALT: 23 U/L (ref 0–53)
AST: 33 U/L (ref 0–37)
BILIRUBIN DIRECT: 0.1 mg/dL (ref 0.0–0.5)
Indirect Bilirubin: 0.6 mg/dL (ref 0.3–0.9)
Total Bilirubin: 0.7 mg/dL (ref 0.3–1.2)
Total Protein: 6.1 g/dL (ref 6.0–8.3)

## 2014-11-04 LAB — I-STAT CHEM 8, ED
BUN: 11 mg/dL (ref 6–23)
CREATININE: 1.3 mg/dL (ref 0.50–1.35)
Calcium, Ion: 1.09 mmol/L — ABNORMAL LOW (ref 1.12–1.23)
Chloride: 93 mmol/L — ABNORMAL LOW (ref 96–112)
Glucose, Bld: 97 mg/dL (ref 70–99)
HCT: 45 % (ref 39.0–52.0)
HEMOGLOBIN: 15.3 g/dL (ref 13.0–17.0)
POTASSIUM: 3.4 mmol/L — AB (ref 3.5–5.1)
Sodium: 134 mmol/L — ABNORMAL LOW (ref 135–145)
TCO2: 25 mmol/L (ref 0–100)

## 2014-11-04 LAB — CBG MONITORING, ED: GLUCOSE-CAPILLARY: 104 mg/dL — AB (ref 70–99)

## 2014-11-04 LAB — MONONUCLEOSIS SCREEN: Mono Screen: NEGATIVE

## 2014-11-04 LAB — RAPID STREP SCREEN (MED CTR MEBANE ONLY): Streptococcus, Group A Screen (Direct): NEGATIVE

## 2014-11-04 LAB — I-STAT CG4 LACTIC ACID, ED: Lactic Acid, Venous: 1.16 mmol/L (ref 0.5–2.0)

## 2014-11-04 LAB — LIPASE, BLOOD: LIPASE: 31 U/L (ref 11–59)

## 2014-11-04 MED ORDER — BENZONATATE 100 MG PO CAPS: 100.0000 mg | ORAL_CAPSULE | Freq: Three times a day (TID) | ORAL | Status: DC

## 2014-11-04 MED ORDER — MORPHINE SULFATE 4 MG/ML IJ SOLN
4.0000 mg | Freq: Once | INTRAMUSCULAR | Status: AC
  Administered 2014-11-04: 4 mg via INTRAVENOUS
  Filled 2014-11-04: qty 1

## 2014-11-04 MED ORDER — ONDANSETRON HCL 4 MG/2ML IJ SOLN
4.0000 mg | Freq: Once | INTRAMUSCULAR | Status: AC
  Administered 2014-11-04: 4 mg via INTRAVENOUS
  Filled 2014-11-04: qty 2

## 2014-11-04 MED ORDER — KETOROLAC TROMETHAMINE 30 MG/ML IJ SOLN
30.0000 mg | Freq: Once | INTRAMUSCULAR | Status: AC
  Administered 2014-11-04: 30 mg via INTRAVENOUS
  Filled 2014-11-04: qty 1

## 2014-11-04 MED ORDER — ONDANSETRON HCL 4 MG PO TABS: 4.0000 mg | ORAL_TABLET | Freq: Four times a day (QID) | ORAL | Status: DC

## 2014-11-04 MED ORDER — ACETAMINOPHEN 325 MG PO TABS
650.0000 mg | ORAL_TABLET | Freq: Once | ORAL | Status: AC
  Administered 2014-11-04: 650 mg via ORAL
  Filled 2014-11-04: qty 2

## 2014-11-04 MED ORDER — SODIUM CHLORIDE 0.9 % IV BOLUS (SEPSIS)
1000.0000 mL | Freq: Once | INTRAVENOUS | Status: AC
  Administered 2014-11-04: 1000 mL via INTRAVENOUS

## 2014-11-04 NOTE — Discharge Instructions (Signed)
Viral Infections °A viral infection can be caused by different types of viruses. Most viral infections are not serious and resolve on their own. However, some infections may cause severe symptoms and may lead to further complications. °SYMPTOMS °Viruses can frequently cause: °· Minor sore throat. °· Aches and pains. °· Headaches. °· Runny nose. °· Different types of rashes. °· Watery eyes. °· Tiredness. °· Cough. °· Loss of appetite. °· Gastrointestinal infections, resulting in nausea, vomiting, and diarrhea. °These symptoms do not respond to antibiotics because the infection is not caused by bacteria. However, you might catch a bacterial infection following the viral infection. This is sometimes called a "superinfection." Symptoms of such a bacterial infection may include: °· Worsening sore throat with pus and difficulty swallowing. °· Swollen neck glands. °· Chills and a high or persistent fever. °· Severe headache. °· Tenderness over the sinuses. °· Persistent overall ill feeling (malaise), muscle aches, and tiredness (fatigue). °· Persistent cough. °· Yellow, green, or brown mucus production with coughing. °HOME CARE INSTRUCTIONS  °· Only take over-the-counter or prescription medicines for pain, discomfort, diarrhea, or fever as directed by your caregiver. °· Drink enough water and fluids to keep your urine clear or pale yellow. Sports drinks can provide valuable electrolytes, sugars, and hydration. °· Get plenty of rest and maintain proper nutrition. Soups and broths with crackers or rice are fine. °SEEK IMMEDIATE MEDICAL CARE IF:  °· You have severe headaches, shortness of breath, chest pain, neck pain, or an unusual rash. °· You have uncontrolled vomiting, diarrhea, or you are unable to keep down fluids. °· You or your child has an oral temperature above 102° F (38.9° C), not controlled by medicine. °· Your baby is older than 3 months with a rectal temperature of 102° F (38.9° C) or higher. °· Your baby is 3  months old or younger with a rectal temperature of 100.4° F (38° C) or higher. °MAKE SURE YOU:  °· Understand these instructions. °· Will watch your condition. °· Will get help right away if you are not doing well or get worse. °Document Released: 04/17/2005 Document Revised: 09/30/2011 Document Reviewed: 11/12/2010 °ExitCare® Patient Information ©2015 [employer]. This information is not intended to replace advice given to you by your health care provider. Make sure you discuss any questions you have with your health care provider. ° ° °Emergency Department Resource Guide °1) Find a Doctor and Pay Out of Pocket °Although you won't have to find out who is covered by your insurance plan, it is a good idea to ask around and get recommendations. You will then need to call the office and see if the doctor you have chosen will accept you as a new patient and what types of options they offer for patients who are self-pay. Some doctors offer discounts or will set up payment plans for their patients who do not have insurance, but you will need to ask so you aren't surprised when you get to your appointment. ° °2) Contact Your Local Health Department °Not all health departments have doctors that can see patients for sick visits, but many do, so it is worth a call to see if yours does. If you don't know where your local health department is, you can check in your phone book. The CDC also has a tool to help you locate your state's health department, and many state websites also have listings of all of their local health departments. ° °3) Find a Walk-in Clinic °If your illness is not likely   to be very severe or complicated, you may want to try a walk in clinic. These are popping up all over the country in pharmacies, drugstores, and shopping centers. They're usually staffed by nurse practitioners or physician assistants that have been trained to treat common illnesses and complaints. They're usually fairly quick and  inexpensive. However, if you have serious medical issues or chronic medical problems, these are probably not your best option. ° °No Primary Care Doctor: °- Call [facility] at  832-8000 - they can help you locate a primary care doctor that  accepts your insurance, provides certain services, etc. °- Physician Referral Service- 1-800-533-3463 ° °Chronic Pain Problems: °Organization         Address  Phone   Notes  °[facility]  (336) 297-2271 Patients need to be referred by their primary care doctor.  ° °Medication Assistance: °Organization         Address  Phone   Notes  °[place] [facility] [address] °[place], [place] [address] (336) 641-8030 --Must be a resident of [place] °-- Must have NO insurance coverage whatsoever (no Medicaid/ Medicare, etc.) °-- The pt. MUST have a primary care doctor that directs their care regularly and follows them in the community °  °[facility]  (866) 331-1348   °[employer]  (888) 892-1162   ° °Agencies that provide inexpensive medical care: °Organization         Address  Phone   Notes  °[facility]  (336) 832-8035   °[facility]    (336) 832-7272   °[facility] [address] °[place], [place] [address] (336) 832-4777   °[facility] [address], °[place] (336) 271-4999   °[facility]    (336) 373-0678   °[facility]    (336) 272-1050   °[facility] ° [address], [place] Phone:  (336) 832-4444, Fax:  (336) 832-4440 Hours of Operation:  9 am - 6 pm, M-F.  Also accepts Medicaid/Medicare and self-pay.  °[facility] ° [address], [place] Phone: (336) 832-3150, Fax: (336) [phone]. Hours of Operation:  8:30 am - 5:30 pm, M-F.  Also accepts Medicaid and self-pay.  °[facility] [place] [address], [place] Phone: (336) 878-6027   °[facility] [address], [place], [place] (336)723-1848, Ext. 123 Mondays & Thursdays: 7-9 AM.  First 15 patients are seen on a first come, first serve basis. °  ° °Medicaid-accepting [place] Providers: ° °Organization         Address  Phone   Notes  °[facility] [address], [place] (336) 641-2100 Also accepts self-pay patients.  °[facility] [address], [place] ° (336) 856-9996   °[facility] [address], [place] (336) 288-8857   °[facility] [address] [place] [address], [place] (336) 299-7000   °Veita Bland [address], [place]  ° (336) 373-1557 Only accepts [place] Access Medicaid patients after they have their name applied to their card.  ° °Self-Pay (no insurance) in [place]: ° °Organization         Address  Phone   Notes  °Sickle Cell Patients, [facility] [address], [place] (336) 832-1970   °[facility] [address], [place] (336) 832-4400   °[facility] [place] ° [address],  (336) 992-4800   °[facility]/Dr. Osei-Bonsu ° [address] [place] [address], [place] or [address], [place] (336) [phone] Phone number for both [place] and [place] locations is the same.  °[facility] [address], [place] (336) 299-0000   °[facility] [address] [place] [address], [place] or [address] (336) 852-7530 °(336) 878-2260   °[facility] [address], [place] (336) 350-1642, phone; (336) 294-5005, fax Sees patients 1st and 3rd Saturday of every month.  Must not qualify for public or private insurance (i.e. Medicaid, Medicare, [place] Health Choice, Veterans' Benefits) • Household income should be no more than 200% of the poverty level •The clinic cannot treat you if you are pregnant or  think you are pregnant • Sexually transmitted diseases are not treated at the clinic.  ° ° °Dental Care: °Organization         Address  Phone  Notes  °[facility] [address], [place] (336) 641-6152 Accepts children up to age 21 who are enrolled in Medicaid or [place] Health Choice; pregnant women with a Medicaid card; and children who have applied for Medicaid or [place] Health Choice, but were declined, whose parents can pay a reduced fee at time of service.  °[facility] [place]  [address], [place] (336) 641-7733 Accepts children up to age 21 who are enrolled in Medicaid or [place] Health Choice; pregnant women with a Medicaid card; and children who have applied for Medicaid or [place] Health Choice, but were declined, whose parents can pay a reduced fee at time of service.  °[facility] ° [address], [place] (336) 641-4533 Patients are seen by appointment only. Walk-ins are not accepted. [facility] will see patients 18 years of age and older. °Monday - Tuesday (8am-5pm) °Most Wednesdays (8:30-5pm) °$30 per visit, cash only  °[facility] ° [address], [place] (336) 641-4533 Patients are seen by appointment only. Walk-ins are not accepted. [facility] will see patients 18 years of age and older. °One Wednesday Evening (Monthly: Volunteer Based).  $30 per visit, cash only  °[facility]  (919) 537-3737 for adults; Children under age 4, call Graduate Pediatric Dentistry at ([phone]) [phone]. Children aged 4-14, please call (919) [phone] to request a pediatric application. ° Dental services are provided in all areas of dental care including fillings, crowns and bridges, complete and partial dentures, implants, gum treatment, root canals, and extractions. Preventive care is also provided. Treatment is provided to both adults  and children. °Patients are selected via a lottery and there is often a waiting list. °  °[facility] [address], °[place] ° (336) 763-8833 [email or web address] °  °[facility] [address], [place],  (336)723-1848, Ext. 123 Second and Fourth Thursday of each month, opens at 6:30 AM; Clinic ends at 9 AM.  Patients are seen on a first-come first-served basis, and a limited number are seen during each clinic.  ° °[facility] ° [address], [place],  (336) 723-7904   Eligibility Requirements °You must have lived in [place], [place], or [place] counties for at least the last three months. °    You cannot be eligible for state or federal sponsored healthcare insurance, including Veterans Administration, Medicaid, or Medicare. °  You generally cannot be eligible for healthcare insurance through your employer.  °  How to apply: °Eligibility screenings are held every Tuesday and Wednesday afternoon from 1:00 pm until 4:00 pm. You do not need an appointment for the interview!  °[facility] [address], [place], [place] 336-631-2330   °[facility]  336-342-8273   °[place]  336-703-3100   ° [facility]  336-570-6415   ° °Behavioral Health Resources in the Community: °Intensive Outpatient Programs °Organization         Address  Phone  Notes  °[facility] [address], [place], [place] 336-878-6098   °[facility] [address], [place], [place] 336-832-9800   °[facility] [address], [place], [place] ° 336-882-2125   °[facility] [address],  °[place], [place] 1-800-853-5163 or 336-641-4981   °Substance Abuse Resources °Organization         Address  Phone  Notes  °[facility]  336-882-2125   °[facility]  336-784-9470   °[facility]  336-285-9073     °[facility]  336-845-3988   °Residential & Outpatient Substance Abuse Program  1-800-659-3381   °Psychological Services °Organization         Address  Phone  Notes  °[facility]  336- 832-9600   °[facility]  336- 378-7881   °[facility] [address], [place] 1-800-853-5163 or 336-641-4981   ° °Mobile Crisis Teams °Organization         Address  Phone  Notes  °[facility], Mobile Crisis Care Unit  1-877-626-1772   °Assertive °[facility] ° [address] [place], [place] 336-834-9664   °Sharon DeEsch [address] °[place] 336-554-5454   ° °Self-Help/Support Groups °Organization         Address  Phone             Notes  °[facility] [place] - variety of support groups  336- 373-1402 Call for more information  °Narcotics Anonymous (NA), [facility] [address], °[place]  2 meetings at this location  ° °Residential Treatment Programs °Organization         Address  Phone  Notes  °[facility] [address],    °[place]  1-866-801-8205   °[facility] ° [address], [place], [place] 704-293-8524   °[facility] [address], [place] 336-845-3988 Admissions: 8am-3pm M-F  °[facility] [address]    °[place], [place] 336-841-1104   °[facility] [address], [place], [place] 336-379-7146   °[facility] [address]  °[place], [place] 336-285-9073   °[facility] Intensive Outpatient [address], [place], [place] 336-852-3033   °[facility] [address]  °[place], [place] 1-877-615-2722 or 336-784-9470   °[facility] [address] [place], [place] 336-227-7417 Accepts Medicaid  °[facility] [address]  °[place] 1-800-659-3381 Substance Abuse/Addiction Treatment  ° °[place]  Behavioral Health Resources °Organization         Address  Phone  Notes  °[facility]  (888) 581-9988   °Julie Brannon, PhD [address] [place], [place]   (336) 349-5553 or (336) 951-0000   °[facility]   [address] °[place], [place] (336) 349-4454   °[facility] [address], [place], [place] (336) 342-8316 Insurance/Medicaid/sponsorship through [facility]  °[facility] [address]                                    [place], [place] (336) 342-8316 Therapy/tele-psych/case  °[facility] [address]  ° [place], [place] (336) 349-2233    °Dr. Arfeen  (336) 349-4544   °[facility] [place]  [employer] [place] [facility] 1) [address], [place] °2) [address], [place] °3)  [address], [place] (336) 349-3220 °(336) 342-7768 ° °(336) 342-8140   °[place] [facility] (336) 342-1394 or (336) 342-3537 (After Hours)    ° ° ° °

## 2014-11-04 NOTE — ED Notes (Signed)
Pt. Refused wheelchair and left with all belongings 

## 2014-11-04 NOTE — ED Notes (Signed)
Pt brought in via [facility] after a fall down a flight of stairs. Pt cant recall how many stairs. Pt denies any neck or back pain. Pt is A/O. Pt reports falling down the stairs due to weakness. Pt has been having fever, chills, body aches, N/V/D and cough for 5 days. EMS V/S 150/80, HR 100, SPO2 99%, and RR 14

## 2014-11-04 NOTE — ED Provider Notes (Signed)
CSN: [id number]     Arrival date & time 11/04/14  1715 History   First MD Initiated Contact with Patient 11/04/14 1715     Chief Complaint  Patient presents with  . Fall  . Fever     (Consider location/radiation/quality/duration/timing/severity/associated sxs/prior Treatment) HPI   24 year old male brought here via EMS for evaluation of a recent fall. Patient reports he has been having flulike symptoms for the past 5 days. He described symptoms as fever, chills, sore throat, productive cough, generalized body aches, having nausea vomiting and diarrhea as well. States he vomiting up food and having one bouts of loose stools but they. He complained of generalized weakness and today while walking down the steps he stumbled and fell. Denies loss of consciousness but unsure how presents he has fallen down. Denies any significant injury from it. Roommate notify EMS and patient was brought here for further evaluation. Patient thinks he has the flu. He denies having flu shot previously. Denies any recent travel. Does not complaining of any significant neck pain, back pain, or other pain related to the fall just generalized body aches. He is a smoker. No prior history concerning for PE/DVT.  Patient does not have a primary care provider.  Furthermore, pt request to have an STD check. Pt sts he was incarcerated for approx 6 months and have been out for 4 months.  Admit to having 5 different sexual partners in that time frame, using protection on occasion.  Denies sexual activity with same sex.  Has prior hx of STD.    Past Medical History  Diagnosis Date  . Medical history non-contributory   . Asthma    Past Surgical History  Procedure Laterality Date  . No past surgeries    . I&d extremity Left 02/02/2014    Procedure: Incision and Drainage of left hand Infection;  Surgeon: Sharma CovertFred W Ortmann, MD;  Location: [facility] OR;  Service: Orthopedics;  Laterality: Left;   No family history on file. History   Substance Use Topics  . Smoking status: Current Every Day Smoker -- 0.50 packs/day    Types: Cigarettes  . Smokeless tobacco: Not on file  . Alcohol Use: Yes    Review of Systems  All other systems reviewed and are negative.     Allergies  Xanax  Home Medications   Prior to Admission medications   Medication Sig Start Date End Date Taking? Authorizing Provider  amoxicillin-clavulanate (AUGMENTIN) 875-125 MG per tablet Take 1 tablet by mouth 2 (two) times daily. 02/02/14   Bradly BienenstockFred Ortmann, MD  oxyCODONE-acetaminophen (PERCOCET) 10-325 MG per tablet Take 1 tablet by mouth every 4 (four) hours as needed for pain. 02/02/14   Bradly BienenstockFred Ortmann, MD   BP 130/75 mmHg  Pulse 97  Temp(Src) 102.8 F (39.3 C) (Rectal)  Resp 33  Ht 5\' 6"  (1.676 m)  Wt 143 lb (64.864 kg)  BMI 23.09 kg/m2  SpO2 99% Physical Exam  Constitutional: He is oriented to person, place, and time. He appears well-developed and well-nourished. No distress.  Patient appears agitated, actively spitting up into a bag, but does not appear toxic.  HENT:  Head: Atraumatic.  Right Ear: External ear normal.  Left Ear: External ear normal.  Nose: Nose normal.  Mouth/Throat: No oropharyngeal exudate.  Throat: Uvula is midline, no tonsillar enlargement or exudates however there is post oropharyngeal erythema. No trismus  Eyes: Conjunctivae are normal.  Neck: Normal range of motion. Neck supple.  C collar in place: on palpation, no cervical  midline spine tenderness or evidence of step-off.  Cardiovascular: Normal rate and regular rhythm.   Pulmonary/Chest: Effort normal and breath sounds normal. He has no wheezes.  Neurological: He is alert and oriented to person, place, and time.  Skin: Skin is warm. No rash noted.  Psychiatric: He has a normal mood and affect.    ED Course  Procedures (including critical care time)  6:10 PM Patient with flulike symptoms, with generalized weakness secondary to his infection. He fell today  and was brought here via EMS. He is febrile with a temperature of 102.8, tachypneic however no hypoxia. Workup initiated.  9:04 PM Temperature improves after taking Tylenol. Vital signs stable. Chest x-ray shows no evidence of lung infection. Labs are reassuring. My evidence of leukopenia with WBC 3.4. Patient was concerning of STD, STD panel obtained. A strep and mononucleosis are negative. Patient felt much better after receiving IV fluid, ambulate without difficulty, and stable for discharge. Symptomatically treatment provided. Resource provided. Return precautions discussed. Patient made aware that STD panel has been sent and if positive he will be contacted. Recommend avoid sexual activities if tested positive for infection. Recommend to have partner get tested as well if STD positive.  Labs Review Labs Reviewed  CBC WITH DIFFERENTIAL/PLATELET - Abnormal; Notable for the following:    WBC 3.4 (*)    Platelets 123 (*)    All other components within normal limits  I-STAT CHEM 8, ED - Abnormal; Notable for the following:    Sodium 134 (*)    Potassium 3.4 (*)    Chloride 93 (*)    Calcium, Ion 1.09 (*)    All other components within normal limits  CBG MONITORING, ED - Abnormal; Notable for the following:    Glucose-Capillary 104 (*)    All other components within normal limits  RAPID STREP SCREEN  CULTURE, GROUP A STREP  MONONUCLEOSIS SCREEN  HEPATIC FUNCTION PANEL  LIPASE, BLOOD  INFLUENZA PANEL BY PCR (TYPE A & B, H1N1)  RPR  HIV ANTIBODY (ROUTINE TESTING)  I-STAT CG4 LACTIC ACID, ED  GC/CHLAMYDIA PROBE AMP ([place])    Imaging Review Dg Chest 2 View  11/04/2014   CLINICAL DATA:  Weakness and body aches for 5 days.  EXAM: CHEST  2 VIEW  COMPARISON:  None.  FINDINGS: The heart size and mediastinal contours are within normal limits. Both lungs are clear. The visualized skeletal structures are unremarkable.  IMPRESSION: Normal chest x-ray.   Electronically Signed   By: Rudie Meyer M.D.   On: 11/04/2014 19:16     EKG Interpretation None      MDM   Final diagnoses:  Cough  Viral infection    BP 125/73 mmHg  Pulse 89  Temp(Src) 100.3 F (37.9 C) (Oral)  Resp 23  Ht  (1.676 m)  Wt 143 lb (64.864 kg)  BMI 23.09 kg/m2  SpO2 97%  I have reviewed nursing notes and vital signs. I personally reviewed the imaging tests through PACS system  I reviewed available ER/hospitalization records thought the EMR     Fayrene Helper, PA-C 11/04/14 2105  Tilden Fossa, MD 11/04/14 2122

## 2014-11-05 LAB — INFLUENZA PANEL BY PCR (TYPE A & B)
H1N1 flu by pcr: NOT DETECTED
INFLAPCR: NEGATIVE
Influenza B By PCR: POSITIVE — AB

## 2014-11-05 LAB — RPR: RPR Ser Ql: NONREACTIVE

## 2014-11-05 LAB — HIV ANTIBODY (ROUTINE TESTING W REFLEX): HIV Screen 4th Generation wRfx: NONREACTIVE

## 2014-11-07 LAB — CULTURE, GROUP A STREP

## 2015-12-20 IMAGING — CR DG CHEST 2V
2 series · 2 of 2 positions shown · non-contrast
Comparison: None.

CLINICAL DATA: Weakness and body aches for 5 days.

EXAM:
CHEST  2 VIEW

[chest lat]
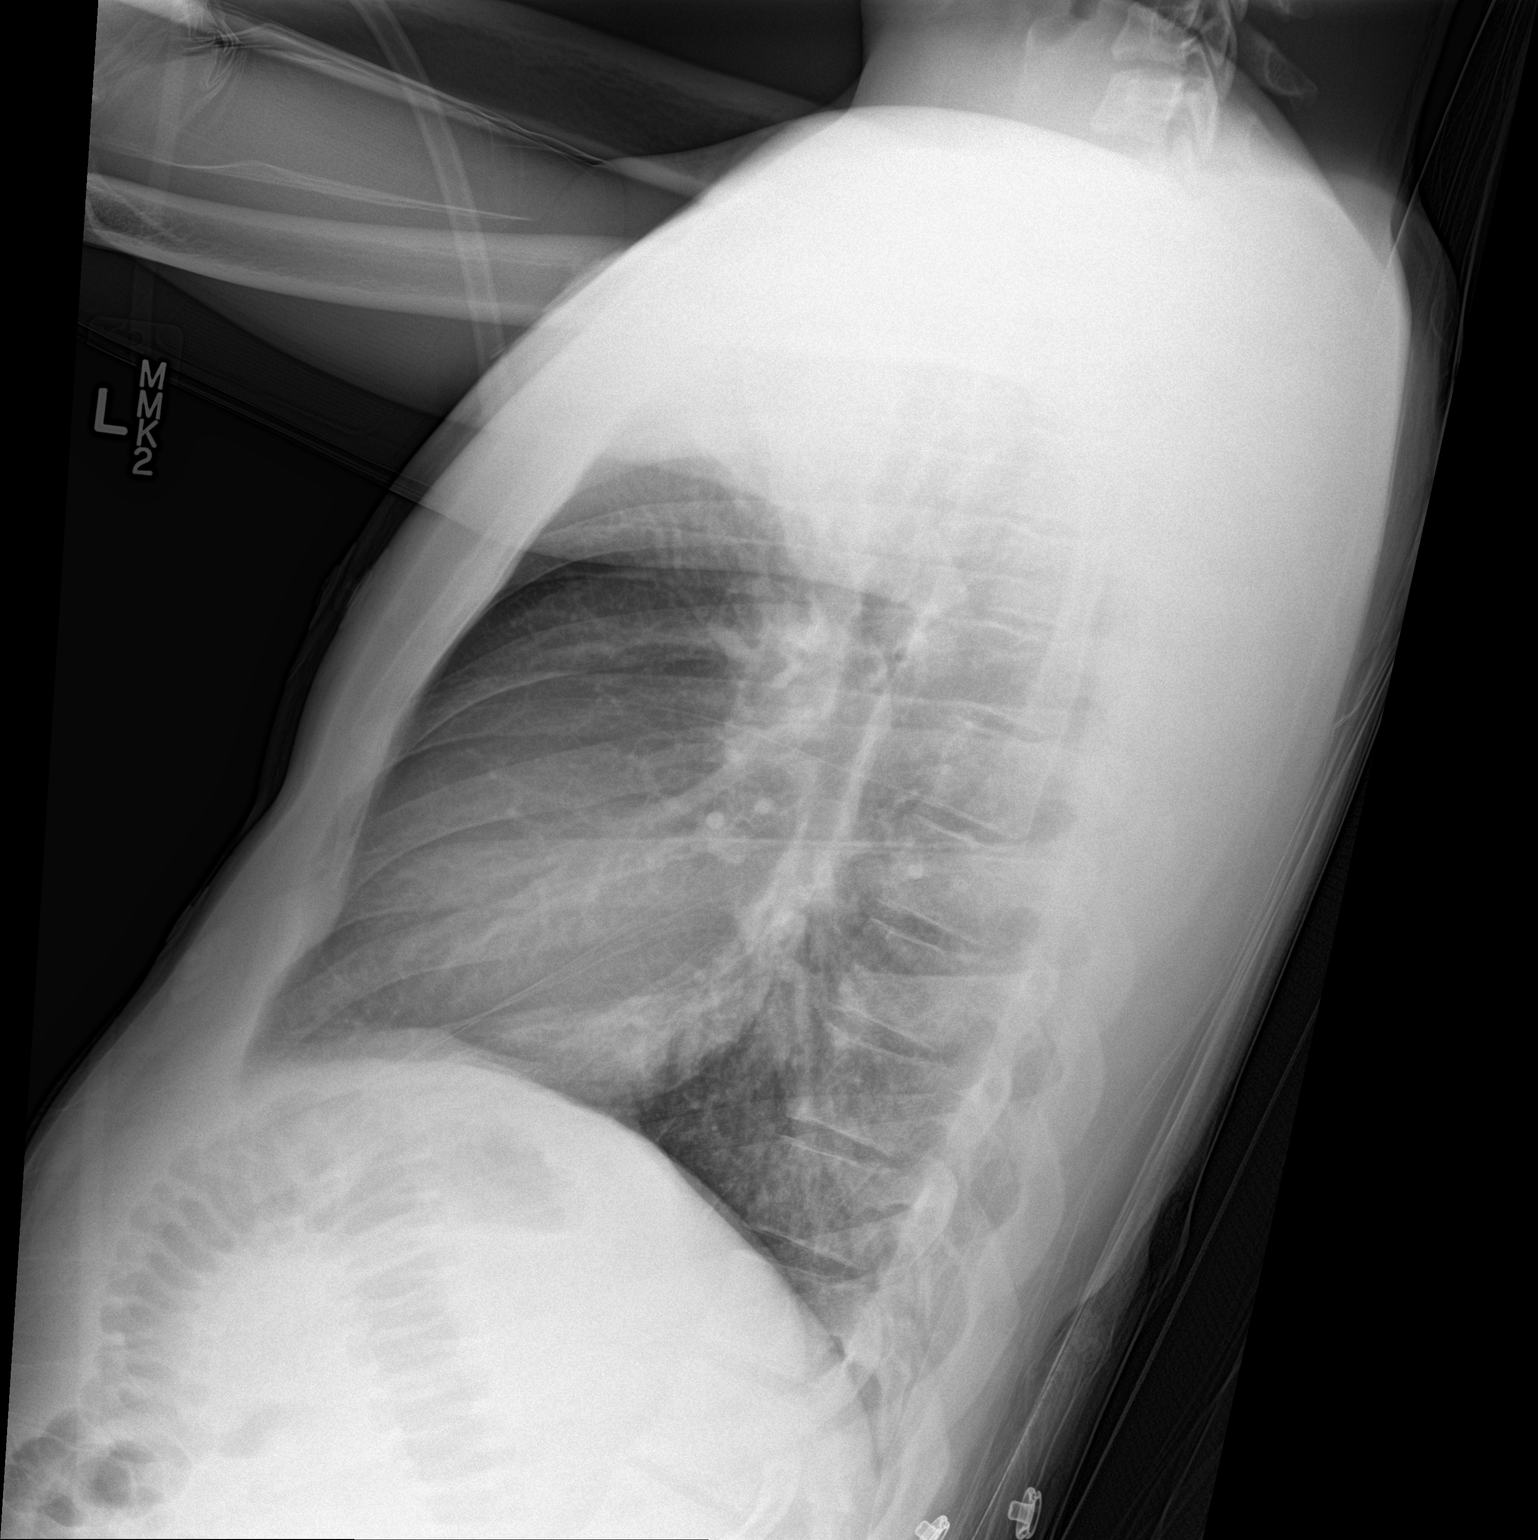

[chest ap]
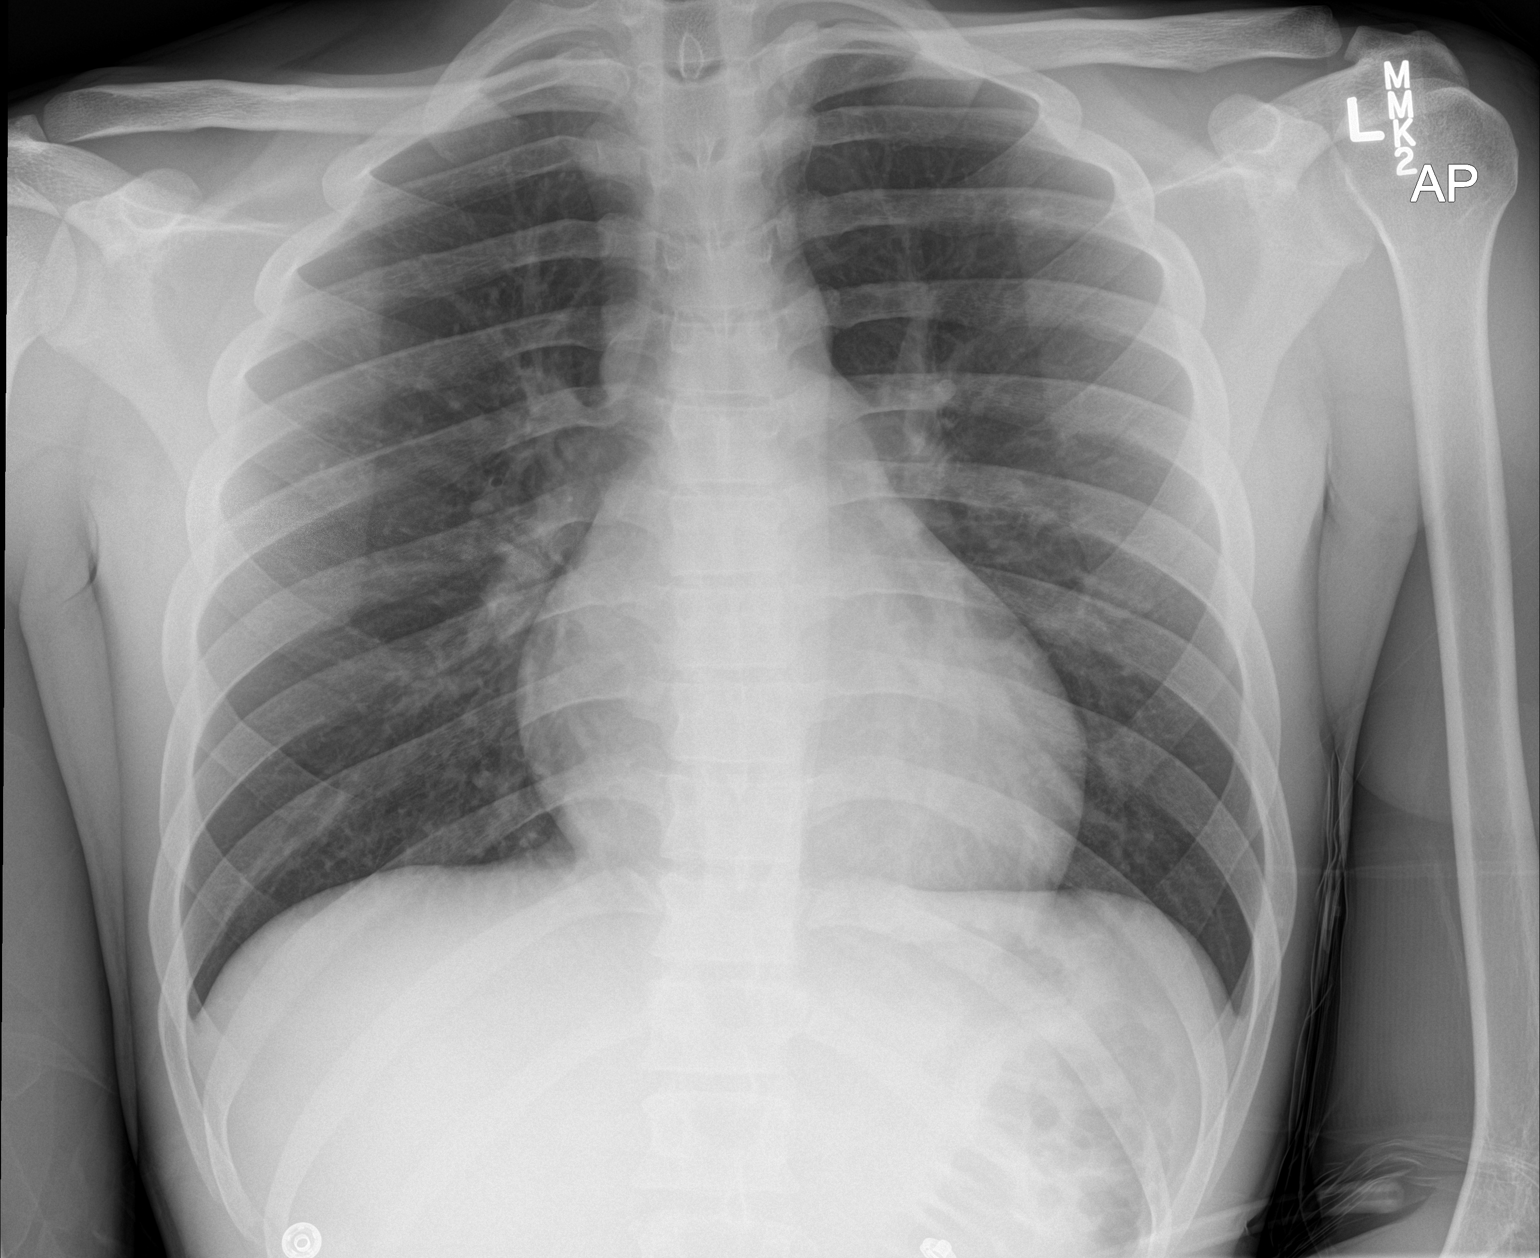

[2 of 2 positions shown; findings below may reference images not displayed]

FINDINGS: The heart size and mediastinal contours are within normal limits.
Both lungs are clear. The visualized skeletal structures are
unremarkable.
IMPRESSION: Normal chest x-ray.

## 2017-09-06 ENCOUNTER — Encounter (HOSPITAL_COMMUNITY): Payer: Self-pay | Admitting: Emergency Medicine

## 2017-09-06 ENCOUNTER — Emergency Department (HOSPITAL_COMMUNITY)
Admission: EM | Admit: 2017-09-06 | Discharge: 2017-09-06 | Disposition: A | Discharge status: Home / Self Care | Payer: Self-pay | Attending: Emergency Medicine | Admitting: Emergency Medicine

## 2017-09-06 ENCOUNTER — Other Ambulatory Visit: Payer: Self-pay

## 2017-09-06 DIAGNOSIS — Z7251 High risk heterosexual behavior: Secondary | ICD-10-CM | POA: Insufficient documentation

## 2017-09-06 DIAGNOSIS — J45909 Unspecified asthma, uncomplicated: Secondary | ICD-10-CM | POA: Insufficient documentation

## 2017-09-06 DIAGNOSIS — R1030 Lower abdominal pain, unspecified: Secondary | ICD-10-CM | POA: Insufficient documentation

## 2017-09-06 DIAGNOSIS — Z202 Contact with and (suspected) exposure to infections with a predominantly sexual mode of transmission: Secondary | ICD-10-CM | POA: Insufficient documentation

## 2017-09-06 DIAGNOSIS — F17210 Nicotine dependence, cigarettes, uncomplicated: Secondary | ICD-10-CM | POA: Insufficient documentation

## 2017-09-06 DIAGNOSIS — Z79899 Other long term (current) drug therapy: Secondary | ICD-10-CM | POA: Insufficient documentation

## 2017-09-06 LAB — URINALYSIS, ROUTINE W REFLEX MICROSCOPIC
BILIRUBIN URINE: NEGATIVE
Bacteria, UA: NONE SEEN
GLUCOSE, UA: NEGATIVE mg/dL
HGB URINE DIPSTICK: NEGATIVE
Ketones, ur: 5 mg/dL — AB
NITRITE: NEGATIVE
PH: 5 (ref 5.0–8.0)
Protein, ur: NEGATIVE mg/dL
SPECIFIC GRAVITY, URINE: 1.03 (ref 1.005–1.030)

## 2017-09-06 LAB — CBC WITH DIFFERENTIAL/PLATELET
Basophils Absolute: 0 10*3/uL (ref 0.0–0.1)
Basophils Relative: 1 %
Eosinophils Absolute: 0.2 10*3/uL (ref 0.0–0.7)
Eosinophils Relative: 3 %
HEMATOCRIT: 40.8 % (ref 39.0–52.0)
HEMOGLOBIN: 13.4 g/dL (ref 13.0–17.0)
Lymphocytes Relative: 45 %
Lymphs Abs: 2.3 10*3/uL (ref 0.7–4.0)
MCH: 31.3 pg (ref 26.0–34.0)
MCHC: 32.8 g/dL (ref 30.0–36.0)
MCV: 95.3 fL (ref 78.0–100.0)
MONOS PCT: 7 %
Monocytes Absolute: 0.4 10*3/uL (ref 0.1–1.0)
NEUTROS ABS: 2.2 10*3/uL (ref 1.7–7.7)
NEUTROS PCT: 44 %
Platelets: 196 10*3/uL (ref 150–400)
RBC: 4.28 MIL/uL (ref 4.22–5.81)
RDW: 13.6 % (ref 11.5–15.5)
WBC: 5.1 10*3/uL (ref 4.0–10.5)

## 2017-09-06 LAB — COMPREHENSIVE METABOLIC PANEL
ALK PHOS: 48 U/L (ref 38–126)
ALT: 15 U/L — ABNORMAL LOW (ref 17–63)
ANION GAP: 9 (ref 5–15)
AST: 18 U/L (ref 15–41)
Albumin: 4.1 g/dL (ref 3.5–5.0)
BILIRUBIN TOTAL: 1 mg/dL (ref 0.3–1.2)
BUN: 12 mg/dL (ref 6–20)
CALCIUM: 9.1 mg/dL (ref 8.9–10.3)
CO2: 25 mmol/L (ref 22–32)
Chloride: 106 mmol/L (ref 101–111)
Creatinine, Ser: 1.06 mg/dL (ref 0.61–1.24)
GFR calc Af Amer: >60 mL/min (ref >60)
GFR calc non Af Amer: >60 mL/min (ref >60)
Glucose, Bld: 88 mg/dL (ref 65–99)
POTASSIUM: 3.7 mmol/L (ref 3.5–5.1)
Sodium: 140 mmol/L (ref 135–145)
TOTAL PROTEIN: 6.4 g/dL — AB (ref 6.5–8.1)

## 2017-09-06 MED ORDER — METRONIDAZOLE 500 MG PO TABS
2000.0000 mg | ORAL_TABLET | Freq: Once | ORAL | Status: AC
  Administered 2017-09-06: 2000 mg via ORAL
  Filled 2017-09-06: qty 4

## 2017-09-06 MED ORDER — ONDANSETRON 4 MG PO TBDP
8.0000 mg | ORAL_TABLET | Freq: Once | ORAL | Status: AC
  Administered 2017-09-06: 8 mg via ORAL
  Filled 2017-09-06: qty 2

## 2017-09-06 MED ORDER — LIDOCAINE HCL (PF) 1 % IJ SOLN
INTRAMUSCULAR | Status: AC
  Administered 2017-09-06: 0.9 mL
  Filled 2017-09-06: qty 5

## 2017-09-06 MED ORDER — AZITHROMYCIN 250 MG PO TABS
1000.0000 mg | ORAL_TABLET | Freq: Once | ORAL | Status: AC
  Administered 2017-09-06: 1000 mg via ORAL
  Filled 2017-09-06: qty 4

## 2017-09-06 MED ORDER — CEFTRIAXONE SODIUM 250 MG IJ SOLR
250.0000 mg | Freq: Once | INTRAMUSCULAR | Status: AC
  Administered 2017-09-06: 250 mg via INTRAMUSCULAR
  Filled 2017-09-06: qty 250

## 2017-09-06 NOTE — Discharge Instructions (Signed)
Your urine today was concerning for possible STD exposure. You have been treated for trichomonas, gonorrhea, and chlamydia today. Follow-up with the health department in 48 hours for the results of your STD tests. If you test positive for STDs, notify all sexual partners of their need to be tested and treated as well. Do not engage in sexual intercourse for one week. Use a condom when sexually active.

## 2017-09-06 NOTE — ED Triage Notes (Signed)
Pt st's that he was exposed to STD's  St's needs to be treated also c/o lower abd pain x's 2 days

## 2017-09-07 NOTE — ED Provider Notes (Signed)
[facility] EMERGENCY [facility]PARTMENT Provider Note   CSN: [id number] Arrival date & time: 09/06/17  2049     History   Chief Complaint Chief Complaint  Patient presents with  . SEXUALLY TRANSMITTED DISEASE  . Abdominal Pain    HPI Luis Huynh is a 27 y.o. male.  27 year old male presents to the emergency department for evaluation of STD exposure.  He states that his girlfriend tested positive for trichomonas.  He states that he needs to be treated.  Triage note reports abdominal pain in the lower abdomen times 2 days.  Patient denies this during my encounter.  He states that he only came out of concern for unprotected sex and exposure to STDs.  He denies any dysuria, penile discharge, genital sores or lesions, scrotal swelling or testicular tenderness.  No associated fevers, nausea, vomiting.      Past Medical History:  Diagnosis Date  . Asthma   . Medical history non-contributory     Patient Active Problem List   Diagnosis Date Noted  . Puncture wound of left hand with infection 02/02/2014    Past Surgical History:  Procedure Laterality Date  . FRACTURE SURGERY    . I&D EXTREMITY Left 02/02/2014   Procedure: Incision and Drainage of left hand Infection;  Surgeon: Sharma CovertFred W Ortmann, MD;  Location: [facility] OR;  Service: Orthopedics;  Laterality: Left;  . NO PAST SURGERIES         Home Medications    Prior to Admission medications   Medication Sig Start Date End Date Taking? Authorizing Provider  amoxicillin-clavulanate (AUGMENTIN) 875-125 MG per tablet Take 1 tablet by mouth 2 (two) times daily. Patient not taking: Reported on 11/04/2014 02/02/14   Bradly Bienenstockrtmann, Fred, MD  benzonatate (TESSALON) 100 MG capsule Take 1 capsule (100 mg total) by mouth every 8 (eight) hours. 11/04/14   Fayrene Helperran, Bowie, PA-C  ondansetron (ZOFRAN) 4 MG tablet Take 1 tablet (4 mg total) by mouth every 6 (six) hours. 11/04/14   Fayrene Helperran, [place], PA-C  oxyCODONE-acetaminophen (PERCOCET) 10-325 MG  per tablet Take 1 tablet by mouth every 4 (four) hours as needed for pain. Patient not taking: Reported on 11/04/2014 02/02/14   Bradly Bienenstockrtmann, Fred, MD    Family History No family history on file.  Social History Social History   Tobacco Use  . Smoking status: Current Every Day Smoker    Packs/day: 0.50    Types: Cigarettes  . Smokeless tobacco: Never Used  Substance Use Topics  . Alcohol use: Yes  . Drug use: Yes    Types: Marijuana     Allergies   Xanax [alprazolam]   Review of Systems Review of Systems Ten systems reviewed and are negative for acute change, except as noted in the HPI.    Physical Exam Updated Vital Signs BP 122/71 (BP Location: Right Arm)   Pulse 78   Temp 98.7 F (37.1 C) (Oral)   Resp 18   Ht 5\' 6"  (1.676 m) Comment: Simultaneous filing. User may not have seen previous data.  Wt 63.5 kg (140 lb) Comment: Simultaneous filing. User may not have seen previous data.  SpO2 100%   BMI 22.60 kg/m   Physical Exam  Constitutional: He is oriented to person, place, and time. He appears well-developed and well-nourished. No distress.  Nontoxic appearing and in no acute distress  HENT:  Head: Normocephalic and atraumatic.  Eyes: Conjunctivae and EOM are normal. No scleral icterus.  Neck: Normal range of motion.  Pulmonary/Chest: Effort normal.  No respiratory distress.  Respirations even and unlabored  Abdominal: He exhibits no distension.  Musculoskeletal: Normal range of motion.  Neurological: He is alert and oriented to person, place, and time. He exhibits normal muscle tone. Coordination normal.  Skin: Skin is warm and dry. No rash noted. He is not diaphoretic. No erythema. No pallor.  Psychiatric: He has a normal mood and affect. His behavior is normal.  Nursing note and vitals reviewed.    ED Treatments / Results  Labs (all labs ordered are listed, but only abnormal results are displayed) Labs Reviewed  COMPREHENSIVE METABOLIC PANEL - Abnormal;  Notable for the following components:      Result Value   Total Protein 6.4 (*)    ALT 15 (*)    All other components within normal limits  URINALYSIS, ROUTINE W REFLEX MICROSCOPIC - Abnormal; Notable for the following components:   Ketones, ur 5 (*)    Leukocytes, UA TRACE (*)    Squamous Epithelial / LPF 0-5 (*)    All other components within normal limits  CBC WITH DIFFERENTIAL/PLATELET  GC/CHLAMYDIA PROBE AMP ([place]) NOT AT [facility]    EKG  EKG Interpretation None       Radiology No results found.  Procedures Procedures (including critical care time)  Medications Ordered in ED Medications  ondansetron (ZOFRAN-ODT) disintegrating tablet 8 mg (8 mg Oral Given 09/06/17 2342)  metroNIDAZOLE (FLAGYL) tablet 2,000 mg (2,000 mg Oral Given 09/06/17 2343)  azithromycin (ZITHROMAX) tablet 1,000 mg (1,000 mg Oral Given 09/06/17 2343)  cefTRIAXone (ROCEPHIN) injection 250 mg (250 mg Intramuscular Given 09/06/17 2343)  lidocaine (PF) (XYLOCAINE) 1 % injection (0.9 mLs  Given 09/06/17 2344)     Initial Impression / Assessment and Plan / ED Course  I have reviewed the triage vital signs and the nursing notes.  Pertinent labs & imaging results that were available during my care of the patient were reviewed by me and considered in my medical decision making (see chart for details).     Patient to be discharged with instructions to follow up with the [facility] Department. Discussed importance of using protection when sexually active. Patient understands that they have GC/Chlamydia cultures pending and that they will need to inform all sexual partners if results return positive. Pt has been treated prophylacticly with Flagyl, azithromycin, and rocephin due to pts history and UA with increased WBCs. He has been informed to refrain from sexual intercourse x 1 week post treatment today. Return precautions discussed and provided. Patient discharged in stable condition with no unaddressed  concerns.   Final Clinical Impressions(s) / ED Diagnoses   Final diagnoses:  History of unprotected sex  Exposure to trichomonas    ED Discharge Orders    None       Antony Madura, PA-C 09/07/17 1610    Alvira Monday, MD 09/07/17 2107

## 2018-05-15 ENCOUNTER — Emergency Department (HOSPITAL_COMMUNITY): Payer: Self-pay

## 2018-05-15 ENCOUNTER — Encounter (HOSPITAL_COMMUNITY): Payer: Self-pay | Admitting: Emergency Medicine

## 2018-05-15 ENCOUNTER — Emergency Department (HOSPITAL_COMMUNITY)
Admission: EM | Admit: 2018-05-15 | Discharge: 2018-05-15 | Disposition: A | Discharge status: Home / Self Care | Payer: Self-pay | Attending: Emergency Medicine | Admitting: Emergency Medicine

## 2018-05-15 DIAGNOSIS — S41001A Unspecified open wound of right shoulder, initial encounter: Secondary | ICD-10-CM | POA: Insufficient documentation

## 2018-05-15 DIAGNOSIS — Y939 Activity, unspecified: Secondary | ICD-10-CM | POA: Insufficient documentation

## 2018-05-15 DIAGNOSIS — S92242B Displaced fracture of medial cuneiform of left foot, initial encounter for open fracture: Secondary | ICD-10-CM | POA: Insufficient documentation

## 2018-05-15 DIAGNOSIS — Y999 Unspecified external cause status: Secondary | ICD-10-CM | POA: Insufficient documentation

## 2018-05-15 DIAGNOSIS — W3400XA Accidental discharge from unspecified firearms or gun, initial encounter: Secondary | ICD-10-CM

## 2018-05-15 DIAGNOSIS — F17200 Nicotine dependence, unspecified, uncomplicated: Secondary | ICD-10-CM | POA: Insufficient documentation

## 2018-05-15 DIAGNOSIS — Y929 Unspecified place or not applicable: Secondary | ICD-10-CM | POA: Insufficient documentation

## 2018-05-15 LAB — BPAM RBC
Blood Product Expiration Date: 201911252359
Blood Product Expiration Date: 201911252359
ISSUE DATE / TIME: 201910252108
ISSUE DATE / TIME: 201910252108
Unit Type and Rh: 9500
Unit Type and Rh: 9500

## 2018-05-15 LAB — TYPE AND SCREEN
ABO/RH(D): A POS
Antibody Screen: NEGATIVE
UNIT DIVISION: 0
Unit division: 0

## 2018-05-15 LAB — BPAM FFP
BLOOD PRODUCT EXPIRATION DATE: 201910302359
Blood Product Expiration Date: 201910302359
ISSUE DATE / TIME: 201910252108
ISSUE DATE / TIME: 201910252108
UNIT TYPE AND RH: 6200
Unit Type and Rh: 6200

## 2018-05-15 LAB — PREPARE FRESH FROZEN PLASMA: UNIT DIVISION: 0

## 2018-05-15 LAB — ABO/RH: ABO/RH(D): A POS

## 2018-05-15 MED ORDER — OXYCODONE-ACETAMINOPHEN 5-325 MG PO TABS: 1.0000 | ORAL_TABLET | Freq: Four times a day (QID) | ORAL | 0 refills | Status: DC | PRN

## 2018-05-15 MED ORDER — MORPHINE SULFATE (PF) 4 MG/ML IV SOLN
6.0000 mg | Freq: Once | INTRAVENOUS | Status: AC
  Administered 2018-05-15: 6 mg via INTRAVENOUS
  Filled 2018-05-15: qty 2

## 2018-05-15 MED ORDER — CEPHALEXIN 500 MG PO CAPS: 500.0000 mg | ORAL_CAPSULE | Freq: Four times a day (QID) | ORAL | 0 refills | Status: DC

## 2018-05-15 MED ORDER — CEFAZOLIN SODIUM-DEXTROSE 2-4 GM/100ML-% IV SOLN
2.0000 g | Freq: Once | INTRAVENOUS | Status: AC
  Administered 2018-05-15: 2 g via INTRAVENOUS

## 2018-05-15 MED ORDER — FENTANYL CITRATE (PF) 100 MCG/2ML IJ SOLN
100.0000 ug | Freq: Once | INTRAMUSCULAR | Status: AC
  Administered 2018-05-15: 100 ug via INTRAVENOUS

## 2018-05-15 MED ORDER — FENTANYL CITRATE (PF) 100 MCG/2ML IJ SOLN
INTRAMUSCULAR | Status: AC
  Administered 2018-05-15: 50 ug via INTRAVENOUS
  Filled 2018-05-15: qty 2

## 2018-05-15 MED ORDER — FENTANYL CITRATE (PF) 100 MCG/2ML IJ SOLN
50.0000 ug | Freq: Once | INTRAMUSCULAR | Status: AC
  Administered 2018-05-15: 50 ug via INTRAVENOUS

## 2018-05-15 MED ORDER — TETANUS-DIPHTH-ACELL PERTUSSIS 5-2.5-18.5 LF-MCG/0.5 IM SUSP: 0.5000 mL | Freq: Once | INTRAMUSCULAR | Status: DC

## 2018-05-15 NOTE — ED Notes (Signed)
Temp of room increased and warm blankets applied.
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
## 2018-05-15 NOTE — Discharge Instructions (Signed)
Take 600 mg of ibuprofen every 6 hours as needed for pain. It is safe to take with percocet and you'll get better pain relief. Keep your foot elevated when you are off of it. Make sure you wear cam walker any time you are on your feet. Take antibiotics until finished. Follow-up with orthopedic surgery.

## 2018-05-15 NOTE — ED Notes (Signed)
Vital signs stable. 
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
## 2018-05-16 NOTE — Progress Notes (Signed)
Orthopedic Tech Progress Note Patient Details:  JARRID LIENHARD Sep 01, 1990 [id number]  Ortho Devices Type of Ortho Device: CAM walker Ortho Device/Splint Location: lle Ortho Device/Splint Interventions: Ordered, Application, Adjustment   Post Interventions Patient Tolerated: Well Instructions Provided: Care of device, Adjustment of device   Trinna Post 05/16/2018, 3:13 AM

## 2018-05-18 ENCOUNTER — Emergency Department (HOSPITAL_COMMUNITY): Payer: Self-pay

## 2018-05-18 ENCOUNTER — Encounter (HOSPITAL_COMMUNITY): Payer: Self-pay | Admitting: Emergency Medicine

## 2018-05-18 ENCOUNTER — Encounter (HOSPITAL_COMMUNITY): Payer: Self-pay | Admitting: *Deleted

## 2018-05-18 ENCOUNTER — Emergency Department (HOSPITAL_COMMUNITY)
Admission: EM | Admit: 2018-05-18 | Discharge: 2018-05-18 | Disposition: A | Discharge status: Home / Self Care | Payer: Self-pay | Attending: Emergency Medicine | Admitting: Emergency Medicine

## 2018-05-18 DIAGNOSIS — S91332D Puncture wound without foreign body, left foot, subsequent encounter: Secondary | ICD-10-CM

## 2018-05-18 DIAGNOSIS — F17210 Nicotine dependence, cigarettes, uncomplicated: Secondary | ICD-10-CM | POA: Insufficient documentation

## 2018-05-18 DIAGNOSIS — S91302D Unspecified open wound, left foot, subsequent encounter: Secondary | ICD-10-CM | POA: Insufficient documentation

## 2018-05-18 DIAGNOSIS — W3400XD Accidental discharge from unspecified firearms or gun, subsequent encounter: Secondary | ICD-10-CM

## 2018-05-18 DIAGNOSIS — Z79899 Other long term (current) drug therapy: Secondary | ICD-10-CM | POA: Insufficient documentation

## 2018-05-18 DIAGNOSIS — Y249XXA Unspecified firearm discharge, undetermined intent, initial encounter: Secondary | ICD-10-CM | POA: Insufficient documentation

## 2018-05-18 DIAGNOSIS — J45909 Unspecified asthma, uncomplicated: Secondary | ICD-10-CM | POA: Insufficient documentation

## 2018-05-18 LAB — CBC WITH DIFFERENTIAL/PLATELET
Abs Immature Granulocytes: 0.03 10*3/uL (ref 0.00–0.07)
Basophils Absolute: 0 10*3/uL (ref 0.0–0.1)
Basophils Relative: 0 %
EOS PCT: 1 %
Eosinophils Absolute: 0.1 10*3/uL (ref 0.0–0.5)
HCT: 41.3 % (ref 39.0–52.0)
Hemoglobin: 13.1 g/dL (ref 13.0–17.0)
Immature Granulocytes: 0 %
LYMPHS PCT: 21 %
Lymphs Abs: 1.7 10*3/uL (ref 0.7–4.0)
MCH: 30.4 pg (ref 26.0–34.0)
MCHC: 31.7 g/dL (ref 30.0–36.0)
MCV: 95.8 fL (ref 80.0–100.0)
MONO ABS: 0.7 10*3/uL (ref 0.1–1.0)
Monocytes Relative: 8 %
Neutro Abs: 5.8 10*3/uL (ref 1.7–7.7)
Neutrophils Relative %: 70 %
Platelets: 162 10*3/uL (ref 150–400)
RBC: 4.31 MIL/uL (ref 4.22–5.81)
RDW: 13.4 % (ref 11.5–15.5)
WBC: 8.3 10*3/uL (ref 4.0–10.5)
nRBC: 0 % (ref 0.0–0.2)

## 2018-05-18 LAB — COMPREHENSIVE METABOLIC PANEL
ALK PHOS: 46 U/L (ref 38–126)
ALT: 16 U/L (ref 0–44)
AST: 27 U/L (ref 15–41)
Albumin: 3.9 g/dL (ref 3.5–5.0)
Anion gap: 8 (ref 5–15)
BUN: 15 mg/dL (ref 6–20)
CHLORIDE: 103 mmol/L (ref 98–111)
CO2: 29 mmol/L (ref 22–32)
CREATININE: 1.12 mg/dL (ref 0.61–1.24)
Calcium: 9.5 mg/dL (ref 8.9–10.3)
GFR calc Af Amer: >60 mL/min (ref >60)
GFR calc non Af Amer: >60 mL/min (ref >60)
Glucose, Bld: 95 mg/dL (ref 70–99)
Potassium: 4 mmol/L (ref 3.5–5.1)
Sodium: 140 mmol/L (ref 135–145)
Total Bilirubin: 0.5 mg/dL (ref 0.3–1.2)
Total Protein: 6.8 g/dL (ref 6.5–8.1)

## 2018-05-18 LAB — I-STAT CG4 LACTIC ACID, ED: Lactic Acid, Venous: 1.39 mmol/L (ref 0.5–1.9)

## 2018-05-18 MED ORDER — KETOROLAC TROMETHAMINE 30 MG/ML IJ SOLN
30.0000 mg | Freq: Once | INTRAMUSCULAR | Status: AC
  Administered 2018-05-18: 30 mg via INTRAMUSCULAR
  Filled 2018-05-18: qty 1

## 2018-05-18 MED ORDER — HYDROCODONE-ACETAMINOPHEN 5-325 MG PO TABS: ORAL_TABLET | ORAL | 0 refills | Status: DC

## 2018-05-18 MED ORDER — NAPROXEN 500 MG PO TABS: 500.0000 mg | ORAL_TABLET | Freq: Two times a day (BID) | ORAL | 0 refills | Status: DC

## 2018-05-18 MED ORDER — SULFAMETHOXAZOLE-TRIMETHOPRIM 800-160 MG PO TABS: 1.0000 | ORAL_TABLET | Freq: Two times a day (BID) | ORAL | 0 refills | Status: AC

## 2018-05-18 MED ORDER — OXYCODONE-ACETAMINOPHEN 5-325 MG PO TABS
1.0000 | ORAL_TABLET | Freq: Once | ORAL | Status: AC
  Administered 2018-05-18: 1 via ORAL
  Filled 2018-05-18: qty 1

## 2018-05-18 MED ORDER — OXYCODONE-ACETAMINOPHEN 5-325 MG PO TABS
1.0000 | ORAL_TABLET | Freq: Once | ORAL | Status: DC
  Filled 2018-05-18: qty 1

## 2018-05-18 MED ORDER — KETOROLAC TROMETHAMINE 30 MG/ML IJ SOLN: 30.0000 mg | Freq: Once | INTRAMUSCULAR | Status: DC
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
## 2018-05-18 NOTE — ED Notes (Signed)
Pt. Refusing percocet. PA made aware
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
## 2019-11-08 ENCOUNTER — Other Ambulatory Visit: Payer: Self-pay

## 2019-11-08 ENCOUNTER — Ambulatory Visit (HOSPITAL_COMMUNITY)
Admission: EM | Admit: 2019-11-08 | Discharge: 2019-11-08 | Disposition: A | Discharge status: Home / Self Care | Payer: Self-pay | Attending: Family Medicine | Admitting: Family Medicine

## 2019-11-08 ENCOUNTER — Encounter (HOSPITAL_COMMUNITY): Payer: Self-pay

## 2019-11-08 DIAGNOSIS — Z113 Encounter for screening for infections with a predominantly sexual mode of transmission: Secondary | ICD-10-CM

## 2019-11-08 DIAGNOSIS — Z202 Contact with and (suspected) exposure to infections with a predominantly sexual mode of transmission: Secondary | ICD-10-CM | POA: Insufficient documentation

## 2019-11-08 MED ORDER — METRONIDAZOLE 500 MG PO TABS: 500.0000 mg | ORAL_TABLET | Freq: Two times a day (BID) | ORAL | 0 refills | Status: AC

## 2019-11-08 MED ORDER — AZITHROMYCIN 250 MG PO TABS
ORAL_TABLET | ORAL | Status: AC
  Filled 2019-11-08: qty 4

## 2019-11-08 MED ORDER — AZITHROMYCIN 250 MG PO TABS
1000.0000 mg | ORAL_TABLET | Freq: Once | ORAL | Status: AC
  Administered 2019-11-08: 1000 mg via ORAL

## 2019-11-08 MED ORDER — LIDOCAINE HCL (PF) 1 % IJ SOLN
INTRAMUSCULAR | Status: AC
  Filled 2019-11-08: qty 2

## 2019-11-08 MED ORDER — CEFTRIAXONE SODIUM 500 MG IJ SOLR
INTRAMUSCULAR | Status: AC
  Filled 2019-11-08: qty 500

## 2019-11-08 MED ORDER — CEFTRIAXONE SODIUM 500 MG IJ SOLR
500.0000 mg | Freq: Once | INTRAMUSCULAR | Status: AC
  Administered 2019-11-08: 16:00:00 500 mg via INTRAMUSCULAR

## 2019-11-08 NOTE — ED Triage Notes (Signed)
Pt states he wants to get some STD test done today. Pt states he has no symptoms.

## 2019-11-08 NOTE — ED Provider Notes (Signed)
[facility]    CSN: [id number] Arrival date & time: 11/08/19  1443      History   Chief Complaint Chief Complaint  Patient presents with  . SEXUALLY TRANSMITTED DISEASE    HPI Luis Huynh is a 29 y.o. male.   First [facility] patient visit in over 4 years.  Pt states he wants to get some STD test done today. Pt states he has no symptoms. Girlfriend has trichomonas.  Patient wants treatment for "everything"     Past Medical History:  Diagnosis Date  . Asthma   . Medical history non-contributory     Patient Active Problem List   Diagnosis Date Noted  . Puncture wound of left hand with infection 02/02/2014    Past Surgical History:  Procedure Laterality Date  . FRACTURE SURGERY    . I & D EXTREMITY Left 02/02/2014   Procedure: Incision and Drainage of left hand Infection;  Surgeon: Linna Hoff, MD;  Location: [facility];  Service: Orthopedics;  Laterality: Left;  . NO PAST SURGERIES         Home Medications    Prior to Admission medications   Medication Sig Start Date End Date Taking? Authorizing Provider  metroNIDAZOLE (FLAGYL) 500 MG tablet Take 1 tablet (500 mg total) by mouth 2 (two) times daily. 11/08/19   Robyn Haber, MD    Family History History reviewed. No pertinent family history.  Social History Social History   Tobacco Use  . Smoking status: Current Every Day Smoker    Packs/day: 0.50    Types: Cigarettes  . Smokeless tobacco: Never Used  Substance Use Topics  . Alcohol use: Never  . Drug use: Yes    Types: Marijuana     Allergies   Xanax [alprazolam]   Review of Systems Review of Systems  All other systems reviewed and are negative.    Physical Exam Triage Vital Signs ED Triage Vitals  Enc Vitals Group     BP 11/08/19 1526 137/80     Pulse Rate 11/08/19 1526 81     Resp 11/08/19 1526 16     Temp 11/08/19 1526 98.7 F (37.1 C)     Temp Source 11/08/19 1526 Oral     SpO2 11/08/19 1526 100 %     Weight  11/08/19 1528 145 lb (65.8 kg)     Height --      Head Circumference --      Peak Flow --      Pain Score 11/08/19 1528 0     Pain Loc --      Pain Edu? --      Excl. in [place]? --    No data found.  Updated Vital Signs BP 137/80 (BP Location: Right Arm)   Pulse 81   Temp 98.7 F (37.1 C) (Oral)   Resp 16   Wt 65.8 kg   SpO2 100%   BMI 24.13 kg/m    Physical Exam Vitals and nursing note reviewed.  Constitutional:      Appearance: Normal appearance.  Eyes:     Conjunctiva/sclera: Conjunctivae normal.  Pulmonary:     Effort: Pulmonary effort is normal.  Genitourinary:    Penis: Normal.      Comments: Circumcised male Musculoskeletal:        General: Normal range of motion.     Cervical back: Normal range of motion and neck supple.  Skin:    General: Skin is warm and dry.  Neurological:  General: No focal deficit present.     Mental Status: He is alert and oriented to person, place, and time.  Psychiatric:        Mood and Affect: Mood normal.        Behavior: Behavior normal.        Thought Content: Thought content normal.        Judgment: Judgment normal.      UC Treatments / Results    Medications Ordered in UC Medications  azithromycin (ZITHROMAX) tablet 1,000 mg (has no administration in time range)  cefTRIAXone (ROCEPHIN) injection 500 mg (has no administration in time range)    Initial Impression / Assessment and Plan / UC Course  I have reviewed the triage vital signs and the nursing notes.  Pertinent labs & imaging results that were available during my care of the patient were reviewed by me and considered in my medical decision making (see chart for details).    Final Clinical Impressions(s) / UC Diagnoses   Final diagnoses:  STD exposure   Discharge Instructions   None    ED Prescriptions    Medication Sig Dispense Auth. Provider   metroNIDAZOLE (FLAGYL) 500 MG tablet Take 1 tablet (500 mg total) by mouth 2 (two) times daily. 14  tablet Elvina Sidle, MD     I have reviewed the PDMP during this encounter.   Elvina Sidle, MD 11/08/19 1559

## 2019-11-09 LAB — CYTOLOGY, (ORAL, ANAL, URETHRAL) ANCILLARY ONLY
Chlamydia: NEGATIVE
Comment: NEGATIVE
Comment: NEGATIVE
Comment: NORMAL
Neisseria Gonorrhea: NEGATIVE
Trichomonas: POSITIVE — AB

## 2022-01-28 ENCOUNTER — Other Ambulatory Visit: Payer: Self-pay

## 2022-01-28 ENCOUNTER — Emergency Department (HOSPITAL_COMMUNITY): Payer: Self-pay

## 2022-01-28 ENCOUNTER — Emergency Department (HOSPITAL_COMMUNITY)
Admission: EM | Admit: 2022-01-28 | Discharge: 2022-01-29 | Disposition: A | Discharge status: Home / Self Care | Payer: Self-pay | Attending: Emergency Medicine | Admitting: Emergency Medicine

## 2022-01-28 ENCOUNTER — Encounter (HOSPITAL_COMMUNITY): Payer: Self-pay

## 2022-01-28 DIAGNOSIS — S61216A Laceration without foreign body of right little finger without damage to nail, initial encounter: Secondary | ICD-10-CM | POA: Insufficient documentation

## 2022-01-28 DIAGNOSIS — S61511A Laceration without foreign body of right wrist, initial encounter: Secondary | ICD-10-CM | POA: Insufficient documentation

## 2022-01-28 DIAGNOSIS — Y92810 Car as the place of occurrence of the external cause: Secondary | ICD-10-CM | POA: Insufficient documentation

## 2022-01-28 DIAGNOSIS — Z23 Encounter for immunization: Secondary | ICD-10-CM | POA: Insufficient documentation

## 2022-01-28 DIAGNOSIS — W25XXXA Contact with sharp glass, initial encounter: Secondary | ICD-10-CM | POA: Insufficient documentation

## 2022-01-28 DIAGNOSIS — J45909 Unspecified asthma, uncomplicated: Secondary | ICD-10-CM | POA: Insufficient documentation

## 2022-01-28 MED ORDER — TETANUS-DIPHTH-ACELL PERTUSSIS 5-2.5-18.5 LF-MCG/0.5 IM SUSY
0.5000 mL | PREFILLED_SYRINGE | Freq: Once | INTRAMUSCULAR | Status: AC
  Administered 2022-01-28: 0.5 mL via INTRAMUSCULAR
  Filled 2022-01-28: qty 0.5

## 2022-01-28 MED ORDER — OXYCODONE-ACETAMINOPHEN 5-325 MG PO TABS
1.0000 | ORAL_TABLET | Freq: Once | ORAL | Status: AC
  Administered 2022-01-28: 1 via ORAL
  Filled 2022-01-28: qty 1

## 2022-01-28 MED ORDER — LIDOCAINE-EPINEPHRINE (PF) 2 %-1:200000 IJ SOLN
20.0000 mL | Freq: Once | INTRAMUSCULAR | Status: AC
  Administered 2022-01-29: 20 mL
  Filled 2022-01-28: qty 20
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
## 2022-01-29 NOTE — ED Notes (Signed)
Pt A&OX4 ambulatory at d/c with independent steady gait, NAD. Pt verbalized understanding of d/c instructions, wound care and follow up care.

## 2022-01-29 NOTE — Discharge Instructions (Signed)
Make sure to keep the lacerations clean and dressed.  Reviewed suture removal in approximately 10 days.  Monitor for signs and symptoms of infection.
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
